# Patient Record
Sex: Female | Born: 1990 | Race: Black or African American | Hispanic: No | Marital: Single | Smoking: Never smoker
Health system: Southern US, Community
[De-identification: ages and names within clinical notes are randomized; demographics above are authoritative.]

## PROBLEM LIST (undated history)

## (undated) DIAGNOSIS — O24419 Gestational diabetes mellitus in pregnancy, unspecified control: Secondary | ICD-10-CM

## (undated) HISTORY — PX: NO PAST SURGERIES: SHX2092

---

## 2013-07-05 ENCOUNTER — Emergency Department (HOSPITAL_COMMUNITY)
Admission: EM | Admit: 2013-07-05 | Discharge: 2013-07-05 | Disposition: A | Discharge status: Home / Self Care | Payer: BC Managed Care – PPO | Attending: Emergency Medicine | Admitting: Emergency Medicine

## 2013-07-05 ENCOUNTER — Encounter (HOSPITAL_COMMUNITY): Payer: Self-pay | Admitting: Emergency Medicine

## 2013-07-05 DIAGNOSIS — R059 Cough, unspecified: Secondary | ICD-10-CM | POA: Insufficient documentation

## 2013-07-05 DIAGNOSIS — R05 Cough: Secondary | ICD-10-CM | POA: Insufficient documentation

## 2013-07-05 DIAGNOSIS — H9209 Otalgia, unspecified ear: Secondary | ICD-10-CM | POA: Insufficient documentation

## 2013-07-05 DIAGNOSIS — R52 Pain, unspecified: Secondary | ICD-10-CM | POA: Insufficient documentation

## 2013-07-05 DIAGNOSIS — R509 Fever, unspecified: Secondary | ICD-10-CM | POA: Insufficient documentation

## 2013-07-05 DIAGNOSIS — J029 Acute pharyngitis, unspecified: Secondary | ICD-10-CM

## 2013-07-05 DIAGNOSIS — Z880 Allergy status to penicillin: Secondary | ICD-10-CM | POA: Insufficient documentation

## 2013-07-05 LAB — RAPID STREP SCREEN (MED CTR MEBANE ONLY): Streptococcus, Group A Screen (Direct): NEGATIVE

## 2013-07-05 MED ORDER — HYDROCODONE-HOMATROPINE 5-1.5 MG/5ML PO SYRP: 5.0000 mL | ORAL_SOLUTION | Freq: Four times a day (QID) | ORAL | Status: DC | PRN

## 2013-07-05 MED ORDER — DEXAMETHASONE SODIUM PHOSPHATE 10 MG/ML IJ SOLN
10.0000 mg | Freq: Once | INTRAMUSCULAR | Status: AC
  Administered 2013-07-05: 10 mg via INTRAMUSCULAR
  Filled 2013-07-05: qty 1

## 2013-07-05 NOTE — ED Notes (Signed)
Pt c/o sore throat x 2 days with body aches

## 2013-07-05 NOTE — ED Notes (Signed)
Harris, PA at bedside for evaluation. 

## 2013-07-05 NOTE — ED Provider Notes (Signed)
CSN: [id number]     Arrival date & time 07/05/13  1229 History  This chart was scribed for non-physician practitioner Arthor Captain, PA-C working with Geoffery Lyons, MD by Joaquin Music, ED Scribe. This patient was seen in room TR05C/TR05C and the patient's care was started at 1:48 PM  Chief Complaint  Patient presents with  . Sore Throat  . Generalized Body Aches   HPI HPI Comments: Latoya Barr is a 22 y.o. female who presents to the Emergency Department complaining of ongoing worsening sore throat and myalgias with associated fever of 101 that began 24 hours ago. Pt states she has pain when swallowing. She states she was having pressure and pain in bilateral ears. She states she was having a cough with some phlegm last night. Pt states she has had a decreased appetite and indicates she has been drinking water. Pt denies having sick contacts. Pt denies rhinorrhea. Pt denies having sick contacts.   Pt states when she was 9-10 y.o. she had a retropharyngeal abscess. She states she was hospitalized. Pt denies having strep throat.  History reviewed. No pertinent past medical history. History reviewed. No pertinent past surgical history. History reviewed. No pertinent family history. History  Substance Use Topics  . Smoking status: Never Smoker   . Smokeless tobacco: Not on file  . Alcohol Use: No   OB History   Grav Para Term Preterm Abortions TAB SAB Ect Mult Living                 Review of Systems  Constitutional: Positive for fever.  HENT: Positive for sore throat. Negative for rhinorrhea.   Musculoskeletal: Positive for myalgias.  All other systems reviewed and are negative.    Allergies  Penicillins  Home Medications  No current outpatient prescriptions on file.  BP 126/76  Pulse 82  Temp(Src) 98.8 F (37.1 C) (Oral)  Resp 16  Ht 5\' 2"  (1.575 m)  Wt 164 lb (74.39 kg)  BMI 29.99 kg/m2  SpO2 98%  Physical Exam  Nursing note and vitals  reviewed. Constitutional: She is oriented to person, place, and time. She appears well-developed and well-nourished. No distress.  HENT:  Head: Normocephalic and atraumatic.  Mild erythema. Uvula edematous and elongated. No exudates on throat. Airway patent. No signs of retropharyngeal  abscess.   Eyes: EOM are normal.  Neck: Neck supple. No tracheal deviation present.  Cardiovascular: Normal rate.   Pulmonary/Chest: Effort normal. No respiratory distress.  Musculoskeletal: Normal range of motion.  Neurological: She is alert and oriented to person, place, and time.  Skin: Skin is warm and dry.  Psychiatric: She has a normal mood and affect. Her behavior is normal.   ED Course  Procedures  DIAGNOSTIC STUDIES: Oxygen Saturation is 98% on RA, normal by my interpretation.    COORDINATION OF CARE: 1:54 PM-Discussed treatment plan which includes administer Decadron while in ED and D/C pt with hycodan. Pt denies Pt agreed to plan.   Labs Review Labs Reviewed  RAPID STREP SCREEN  CULTURE, GROUP A STREP   Imaging Review No results found.  EKG Interpretation   None       MDM   1. Pharyngitis     Pt afebrile without tonsillar exudate, negative strep. Presents with mild cervical lymphadenopathy, & dysphagia; diagnosis of viral pharyngitis. No abx indicated. DC w symptomatic tx for pain  Pt does not appear dehydrated, but did discuss importance of water rehydration. Presentation non concerning for PTA or infxn spread to soft  tissue. No trismus or uvula deviation. Specific return precautions discussed. Pt able to drink water in ED without difficulty with intact air way. Recommended PCP follow up.  I personally performed the services described in this documentation, which was scribed in my presence. The recorded information has been reviewed and is accurate.     Arthor Captain, PA-C 07/05/13 1844

## 2013-07-06 NOTE — ED Provider Notes (Signed)
Medical screening examination/treatment/procedure(s) were performed by non-physician practitioner and as supervising physician I was immediately available for consultation/collaboration.     Mccoy Testa, MD 07/06/13 0725 

## 2013-07-07 LAB — CULTURE, GROUP A STREP

## 2014-07-30 ENCOUNTER — Emergency Department (INDEPENDENT_AMBULATORY_CARE_PROVIDER_SITE_OTHER)
Admission: EM | Admit: 2014-07-30 | Discharge: 2014-07-30 | Disposition: A | Discharge status: Home / Self Care | Payer: BC Managed Care – PPO | Source: Home / Self Care | Attending: Emergency Medicine | Admitting: Emergency Medicine

## 2014-07-30 ENCOUNTER — Encounter (HOSPITAL_COMMUNITY): Payer: Self-pay | Admitting: Emergency Medicine

## 2014-07-30 DIAGNOSIS — G43109 Migraine with aura, not intractable, without status migrainosus: Secondary | ICD-10-CM

## 2014-07-30 LAB — POCT URINALYSIS DIP (DEVICE)
BILIRUBIN URINE: NEGATIVE
Glucose, UA: NEGATIVE mg/dL
Ketones, ur: NEGATIVE mg/dL
Leukocytes, UA: NEGATIVE
NITRITE: NEGATIVE
PH: 6 (ref 5.0–8.0)
PROTEIN: NEGATIVE mg/dL
Specific Gravity, Urine: 1.02 (ref 1.005–1.030)
Urobilinogen, UA: 0.2 mg/dL (ref 0.0–1.0)

## 2014-07-30 LAB — POCT PREGNANCY, URINE: Preg Test, Ur: NEGATIVE

## 2014-07-30 MED ORDER — METOCLOPRAMIDE HCL 5 MG/ML IJ SOLN
10.0000 mg | Freq: Once | INTRAMUSCULAR | Status: AC
  Administered 2014-07-30: 10 mg via INTRAMUSCULAR

## 2014-07-30 MED ORDER — DICLOFENAC SODIUM 75 MG PO TBEC: DELAYED_RELEASE_TABLET | ORAL | Status: DC

## 2014-07-30 MED ORDER — KETOROLAC TROMETHAMINE 60 MG/2ML IM SOLN
60.0000 mg | Freq: Once | INTRAMUSCULAR | Status: AC
  Administered 2014-07-30: 60 mg via INTRAMUSCULAR

## 2014-07-30 MED ORDER — KETOROLAC TROMETHAMINE 60 MG/2ML IM SOLN
INTRAMUSCULAR | Status: AC
  Filled 2014-07-30: qty 2

## 2014-07-30 MED ORDER — METOCLOPRAMIDE HCL 10 MG PO TABS
ORAL_TABLET | ORAL | Status: DC
Start: 2014-07-30 — End: 2015-05-14

## 2014-07-30 MED ORDER — DIPHENHYDRAMINE HCL 50 MG/ML IJ SOLN
INTRAMUSCULAR | Status: AC
  Filled 2014-07-30: qty 1

## 2014-07-30 MED ORDER — METOCLOPRAMIDE HCL 5 MG/ML IJ SOLN
INTRAMUSCULAR | Status: AC
  Filled 2014-07-30: qty 2

## 2014-07-30 MED ORDER — DIPHENHYDRAMINE HCL 50 MG/ML IJ SOLN
25.0000 mg | Freq: Once | INTRAMUSCULAR | Status: AC
  Administered 2014-07-30: 25 mg via INTRAMUSCULAR

## 2014-07-30 NOTE — ED Notes (Signed)
Pt states that she has had headaches and vomiting for a week. Along with being dizzy.

## 2014-07-30 NOTE — Discharge Instructions (Signed)

## 2014-07-30 NOTE — ED Provider Notes (Signed)
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 The headaches are bitemporal and throbbing in nature. Rated 7/10 in intensity. The headaches begin gradually and peak over about 10-15 minutes. The patient has not had any prior history of migraine headaches. She's had a migraine type headache almost every day this past week, and she states these are the worst of her life, but they're not continuous. Sometimes they'll last up to a day at time. They are associated with bright spots in front of her eyes, nausea, vomiting, dizziness, and phonophobia but no photophobia. She denies any other neurological symptoms such as numbness, paresthesias, weakness, difficulty with speech, swallowing, ambulation, coordination, or balance. The patient had a fever to 101 Wednesday which was a week ago. She's not had any fever since then. She denies any stiff neck. She denies any head trauma or use of anticoagulants. The patient is not pregnant right now or in the immediate postpartum time period. She states no one else at her residence is sick with a similar headache. She denies any eye pain or blurry vision. She's had no neck trauma. No jaw claudication. No history of thrombophlebitis or blood clots.  Review of Systems   Other than as noted above, the patient denies any of the following symptoms: Systemic:  No fever, chills, photophobia, or stiff neck. Eye:  No blurred vision, or diplopia. ENT:  No nasal congestion, rhinorrhea, sinus pressure or pain, or sore throat.  No jaw claudication. Neuro:  No paresthesias, loss of consciousness, seizure activity, muscle weakness, trouble with coordination or gait, trouble speaking or swallowing. Psych:  No depression, anxiety or trouble sleeping.  PMFSH   Past medical history, family history, social history, meds, and allergies were reviewed.     Physical Examination    Vital signs:  BP 119/67 mmHg  Pulse 76  Temp(Src) 98 F (36.7 C) (Oral)  Resp 16  SpO2 100%  LMP 07/15/2014 General:  Alert and oriented.  In no distress. Eye:  Lids and conjunctivas normal.  PERRL,  Full EOMs.  Fundi benign with normal discs and vessels. There was no papilledema.  ENT:  No cranial or facial tenderness to palpation.  TMs and canals clear.  Nasal mucosa was normal and uncongested without any drainage. No intra oral lesions, pharynx clear, mucous membranes moist, dentition normal. Neck:  Supple, full ROM, no tenderness to palpation.  No adenopathy or mass. Neuro:  Alert and orented times 3.  Speech was clear, fluent, and appropriate.  Cranial nerves intact. No pronator drift, muscle strength normal. Finger to nose normal.  DTRs were 2+ and symmetrical.Station and gait were normal.  Romberg's sign was normal.  Able to perform tandem gait well. Psych:  Normal affect.   Course in Urgent Care Center   The following medications were given:  Medications  ketorolac (TORADOL) injection 60 mg (60 mg Intramuscular Given 07/30/14 1616)  diphenhydrAMINE (BENADRYL) injection 25 mg (25 mg Intramuscular Given 07/30/14 1617)  metoCLOPramide (REGLAN) injection 10 mg (10 mg Intramuscular Given 07/30/14 1617)   Assessment   The encounter diagnosis was Migraine with aura and without status migrainosus, not intractable.  There is no evidence of subarachnoid hemorrhage, meningitis, encephalitis, subdural hematoma, epidural hematoma, acute glaucoma, carotid dissection, temporal arteritis, cavernous sinus thrombosis, carbon monoxide toxicity, or pseudotumor cerebri.    Plan   1.  Meds:  The following meds  were prescribed:   Discharge Medication List as of 07/30/2014  3:56 PM    START taking these medications   Details  diclofenac (VOLTAREN) 75 MG EC tablet 1 every 12 hours as needed for migraine, Normal    metoCLOPramide (REGLAN) 10 MG tablet 1 every 12 hours  as needed for migraine, Normal        2.  Patient Education/Counseling:  The patient was given appropriate handouts, self care instructions, and instructed in pain control.    3.  Follow up:  The patient was told to follow up here if no better in 2 to 3 days, or sooner if becoming worse in any way, and given some red flag symptoms such as fever, worsening pain, persistent vomiting, or new neurological symptoms which would prompt immediate return.  Follow-up with primary care physician or a headache specialist in the next week to 2.     Reuben Likesavid C Patina Spanier, MD 07/30/14 [phone]

## 2015-01-01 LAB — OB RESULTS CONSOLE HEPATITIS B SURFACE ANTIGEN: HEP B S AG: NEGATIVE

## 2015-01-01 LAB — OB RESULTS CONSOLE RUBELLA ANTIBODY, IGM: RUBELLA: IMMUNE

## 2015-01-01 LAB — OB RESULTS CONSOLE GC/CHLAMYDIA
CHLAMYDIA, DNA PROBE: NEGATIVE
GC PROBE AMP, GENITAL: NEGATIVE

## 2015-01-01 LAB — OB RESULTS CONSOLE RPR: RPR: NONREACTIVE

## 2015-04-26 ENCOUNTER — Encounter (HOSPITAL_COMMUNITY): Payer: Self-pay | Admitting: *Deleted

## 2015-04-26 ENCOUNTER — Inpatient Hospital Stay (HOSPITAL_COMMUNITY)
Admission: AD | Admit: 2015-04-26 | Discharge: 2015-04-26 | Disposition: A | Discharge status: Home / Self Care | Payer: BLUE CROSS/BLUE SHIELD | Source: Ambulatory Visit | Attending: Obstetrics & Gynecology | Admitting: Obstetrics & Gynecology

## 2015-04-26 DIAGNOSIS — O2313 Infections of bladder in pregnancy, third trimester: Secondary | ICD-10-CM | POA: Diagnosis not present

## 2015-04-26 DIAGNOSIS — N3001 Acute cystitis with hematuria: Secondary | ICD-10-CM

## 2015-04-26 DIAGNOSIS — Z880 Allergy status to penicillin: Secondary | ICD-10-CM | POA: Insufficient documentation

## 2015-04-26 DIAGNOSIS — R1030 Lower abdominal pain, unspecified: Secondary | ICD-10-CM | POA: Diagnosis present

## 2015-04-26 DIAGNOSIS — O24419 Gestational diabetes mellitus in pregnancy, unspecified control: Secondary | ICD-10-CM

## 2015-04-26 DIAGNOSIS — Z3A35 35 weeks gestation of pregnancy: Secondary | ICD-10-CM | POA: Insufficient documentation

## 2015-04-26 DIAGNOSIS — O26893 Other specified pregnancy related conditions, third trimester: Secondary | ICD-10-CM | POA: Diagnosis not present

## 2015-04-26 HISTORY — DX: Gestational diabetes mellitus in pregnancy, unspecified control: O24.419

## 2015-04-26 LAB — OB RESULTS CONSOLE GC/CHLAMYDIA: GC PROBE AMP, GENITAL: NEGATIVE

## 2015-04-26 LAB — COMPREHENSIVE METABOLIC PANEL
ALBUMIN: 2.8 g/dL — AB (ref 3.5–5.0)
ALT: 25 U/L (ref 14–54)
AST: 31 U/L (ref 15–41)
Alkaline Phosphatase: 112 U/L (ref 38–126)
Anion gap: 12 (ref 5–15)
BUN: 7 mg/dL (ref 6–20)
CO2: 25 mmol/L (ref 22–32)
Calcium: 9 mg/dL (ref 8.9–10.3)
Chloride: 99 mmol/L — ABNORMAL LOW (ref 101–111)
Creatinine, Ser: 0.59 mg/dL (ref 0.44–1.00)
GFR calc Af Amer: >60 mL/min (ref >60)
GFR calc non Af Amer: >60 mL/min (ref >60)
GLUCOSE: 102 mg/dL — AB (ref 65–99)
POTASSIUM: 3.6 mmol/L (ref 3.5–5.1)
SODIUM: 136 mmol/L (ref 135–145)
Total Bilirubin: 0.4 mg/dL (ref 0.3–1.2)
Total Protein: 6.6 g/dL (ref 6.5–8.1)

## 2015-04-26 LAB — CBC
HCT: 34.8 % — ABNORMAL LOW (ref 36.0–46.0)
Hemoglobin: 11.5 g/dL — ABNORMAL LOW (ref 12.0–15.0)
MCH: 28.3 pg (ref 26.0–34.0)
MCHC: 33 g/dL (ref 30.0–36.0)
MCV: 85.7 fL (ref 78.0–100.0)
Platelets: 311 10*3/uL (ref 150–400)
RBC: 4.06 MIL/uL (ref 3.87–5.11)
RDW: 15.4 % (ref 11.5–15.5)
WBC: 7.7 10*3/uL (ref 4.0–10.5)

## 2015-04-26 LAB — OB RESULTS CONSOLE GBS: GBS: POSITIVE

## 2015-04-26 LAB — URINALYSIS, ROUTINE W REFLEX MICROSCOPIC
Bilirubin Urine: NEGATIVE
GLUCOSE, UA: NEGATIVE mg/dL
Ketones, ur: 15 mg/dL — AB
Nitrite: NEGATIVE
PROTEIN: 100 mg/dL — AB
Specific Gravity, Urine: 1.02 (ref 1.005–1.030)
UROBILINOGEN UA: 0.2 mg/dL (ref 0.0–1.0)
pH: 7 (ref 5.0–8.0)

## 2015-04-26 LAB — WET PREP, GENITAL
CLUE CELLS WET PREP: NONE SEEN
Trich, Wet Prep: NONE SEEN
WBC, Wet Prep HPF POC: NONE SEEN
Yeast Wet Prep HPF POC: NONE SEEN

## 2015-04-26 LAB — URINE MICROSCOPIC-ADD ON

## 2015-04-26 MED ORDER — CEPHALEXIN 500 MG PO CAPS: 500.0000 mg | ORAL_CAPSULE | Freq: Four times a day (QID) | ORAL | Status: DC

## 2015-04-26 NOTE — MAU Provider Note (Signed)
History     CSN: [id number] Arrival date and time: 04/26/15 4098 First Provider Initiated Contact with Patient 04/26/15 [phone]      Chief Complaint  Patient presents with  . Abdominal Pain   HPI Patient is 24 y.o. G1P0 [redacted]w[redacted]d here with complaints of lower abdominal pain since 9/1, Pain is worse in the suprapubic area,  worsening this AM, reports as constant. Worse with side lying, walking, going up stairs. Denies alleviating. Has not tried tylenol. Denies trying heating pad. Warm bath did not help.  Denies dysuria, polyuria, denies flank pain.   Rates the pain: 10/10 (here worse pain previous is braxton hicks, dislocated knee)  +FM, denies LOF, VB, contractions +clear/mushy, looks like gel vaginal discharge.  [facility] in [place], [place] Reports some elevated pressures in early pregnancy BS < 100 in AM, after eating < 120  OB History    Gravida Para Term Preterm AB TAB SAB Ectopic Multiple Living   1               Past Medical History  Diagnosis Date  . Gestational diabetes     on medication    Past Surgical History  Procedure Laterality Date  . No past surgeries      No family history on file.  Social History  Substance Use Topics  . Smoking status: Never Smoker   . Smokeless tobacco: None  . Alcohol Use: No    Allergies:  Allergies  Allergen Reactions  . Penicillins Other (See Comments)  . Amoxicillin     Prescriptions prior to admission  Medication Sig Dispense Refill Last Dose  . diclofenac (VOLTAREN) 75 MG EC tablet 1 every 12 hours as needed for migraine 30 tablet 2   . HYDROcodone-homatropine (HYCODAN) 5-1.5 MG/5ML syrup Take 5 mLs by mouth every 6 (six) hours as needed for cough. 30 mL 0   . metoCLOPramide (REGLAN) 10 MG tablet 1 every 12 hours as needed for migraine 30 tablet 2     Review of Systems  Constitutional: Negative for fever and chills.  Eyes: Negative for blurred vision and double vision.  Respiratory: Negative for cough  (used to get mirgaines. had 1 HA when she choked on her spit) and shortness of breath.   Cardiovascular: Negative for chest pain and orthopnea.  Gastrointestinal: Positive for vomiting (happened once last night) and abdominal pain (midepigastric). Negative for nausea, constipation and blood in stool.  Genitourinary: Negative for dysuria, frequency and flank pain.  Musculoskeletal: Negative for myalgias.  Skin: Negative for rash.  Neurological: Positive for headaches. Negative for dizziness, tingling and weakness.  Endo/Heme/Allergies: Does not bruise/bleed easily.  Psychiatric/Behavioral: Negative for depression and suicidal ideas. The patient is not nervous/anxious.    Physical Exam   Blood pressure 120/72, pulse 107, temperature 97.6 F (36.4 C), temperature source Oral, resp. rate 20, height  (1.6 m), weight 215 lb (97.523 kg), last menstrual period 07/15/2014.  Physical Exam  Constitutional: She is oriented to person, place, and time. She appears well-developed and well-nourished. No distress.  Pregnant female  HENT:  Head: Normocephalic and atraumatic.  Eyes: Conjunctivae are normal. No scleral icterus.  Neck: Normal range of motion. Neck supple.  Cardiovascular: Normal rate and intact distal pulses.   Respiratory: Effort normal. She exhibits no tenderness.  GI: Soft. There is tenderness (TTP in the suprapubic region. Mild TTP epigastric but none over RUQ). There is no rebound and no guarding.  Gravid. No CVA tenderness.   Genitourinary: Vagina  normal.  Musculoskeletal: Normal range of motion. She exhibits no edema.  Neurological: She is alert and oriented to person, place, and time. No cranial nerve deficit. Coordination normal.  Skin: Skin is warm and dry. No rash noted.  Psychiatric: She has a normal mood and affect.    MAU Course  Procedures  MDM UA- + hematuria, +LE CMP CBC Type&Screen Wet prep- negative for BV/yeast/trich GC/CT collected  NST  145/mod/+accels, no decels Toco: occasional ctx, not felt by patient  Assessment and Plan  Latoya Barr is a 24 y.o. G1P0 at [redacted]w[redacted]d presenting with abdominal pain, suprapubic pain with UA suggestive UTI.   #Lower Abdominal pain: ddx includes preterm labor, urinary tract infection, pelvic infection. Unlikely PTL given this is not an intermittent pain and toco that is reassuring, most likely cystitis given location of pain and hematuria- not concerned for pyelo give lack of systemic sx like fever and CVA tenderness.  Wet prep negative thus unlikely BV, yeast, trich. Could be GC/CT but these cx will be not be back for 48 hr - UA suggestive of UTI, follow up urine culture - Will treat Keflex, discussed with patient her PCN allergy and chance of cross reaction of 10% and that should she develop hives she should stop immediately and come to MAU. She voiced understanding - preterm labor precautions  #FWB: Cat I strip, reactive.   #Pregnancy care: patient previous seen at another facility but "might deliver here" - request records from outside facility - reviewed pregnancy hx with patient - obtained limited prenatal labs   Federico Flake 04/26/2015, 8:51 AM

## 2015-04-26 NOTE — MAU Note (Addendum)
Pt C/O lower abd pain since 0400, denies bleeding or LOF.  Denies dysuria.  Gets PNC in [place].

## 2015-04-26 NOTE — Discharge Instructions (Signed)

## 2015-04-27 LAB — HIV ANTIBODY (ROUTINE TESTING W REFLEX): HIV Screen 4th Generation wRfx: NONREACTIVE

## 2015-04-27 LAB — RPR: RPR Ser Ql: NONREACTIVE

## 2015-04-28 LAB — CULTURE, OB URINE: Special Requests: NORMAL

## 2015-04-30 LAB — GC/CHLAMYDIA PROBE AMP (~~LOC~~) NOT AT ARMC
Chlamydia: NEGATIVE
Neisseria Gonorrhea: NEGATIVE

## 2015-05-09 ENCOUNTER — Inpatient Hospital Stay (HOSPITAL_COMMUNITY)
Admission: AD | Admit: 2015-05-09 | Discharge: 2015-05-09 | Disposition: A | Discharge status: Home / Self Care | Payer: BLUE CROSS/BLUE SHIELD | Source: Ambulatory Visit | Attending: Family Medicine | Admitting: Family Medicine

## 2015-05-09 ENCOUNTER — Encounter (HOSPITAL_COMMUNITY): Payer: Self-pay | Admitting: *Deleted

## 2015-05-09 DIAGNOSIS — O24419 Gestational diabetes mellitus in pregnancy, unspecified control: Secondary | ICD-10-CM

## 2015-05-09 DIAGNOSIS — O368130 Decreased fetal movements, third trimester, not applicable or unspecified: Secondary | ICD-10-CM | POA: Diagnosis present

## 2015-05-09 DIAGNOSIS — Z3A37 37 weeks gestation of pregnancy: Secondary | ICD-10-CM | POA: Diagnosis not present

## 2015-05-09 NOTE — Discharge Instructions (Signed)
Braxton Hicks Contractions °Contractions of the uterus can occur throughout pregnancy. Contractions are not always a sign that you are in labor.  °WHAT ARE BRAXTON HICKS CONTRACTIONS?  °Contractions that occur before labor are called Braxton Hicks contractions, or false labor. Toward the end of pregnancy (32-34 weeks), these contractions can develop more often and may become more forceful. This is not true labor because these contractions do not result in opening (dilatation) and thinning of the cervix. They are sometimes difficult to tell apart from true labor because these contractions can be forceful and people have different pain tolerances. You should not feel embarrassed if you go to the hospital with false labor. Sometimes, the only way to tell if you are in true labor is for your health care provider to look for changes in the cervix. °If there are no prenatal problems or other health problems associated with the pregnancy, it is completely safe to be sent home with false labor and await the onset of true labor. °HOW CAN YOU TELL THE DIFFERENCE BETWEEN TRUE AND FALSE LABOR? °False Labor °· The contractions of false labor are usually shorter and not as hard as those of true labor.   °· The contractions are usually irregular.   °· The contractions are often felt in the front of the lower abdomen and in the groin.   °· The contractions may go away when you walk around or change positions while lying down.   °· The contractions get weaker and are shorter lasting as time goes on.   °· The contractions do not usually become progressively stronger, regular, and closer together as with true labor.   °True Labor °1. Contractions in true labor last 30-70 seconds, become very regular, usually become more intense, and increase in frequency.   °2. The contractions do not go away with walking.   °3. The discomfort is usually felt in the top of the uterus and spreads to the lower abdomen and low back.   °4. True labor can  be determined by your health care provider with an exam. This will show that the cervix is dilating and getting thinner.   °WHAT TO REMEMBER °· Keep up with your usual exercises and follow other instructions given by your health care provider.   °· Take medicines as directed by your health care provider.   °· Keep your regular prenatal appointments.   °· Eat and drink lightly if you think you are going into labor.   °· If Braxton Hicks contractions are making you uncomfortable:   °· Change your position from lying down or resting to walking, or from walking to resting.   °· Sit and rest in a tub of warm water.   °· Drink 2-3 glasses of water. Dehydration may cause these contractions.   °· Do slow and deep breathing several times an hour.   °WHEN SHOULD I SEEK IMMEDIATE MEDICAL CARE? °Seek immediate medical care if: °· Your contractions become stronger, more regular, and closer together.   °· You have fluid leaking or gushing from your vagina.   °· You have a fever.   °· You pass blood-tinged mucus.   °· You have vaginal bleeding.   °· You have continuous abdominal pain.   °· You have low back pain that you never had before.   °· You feel your baby's head pushing down and causing pelvic pressure.   °· Your baby is not moving as much as it used to.   °Document Released: 08/10/2005 Document Revised: 08/15/2013 Document Reviewed: 05/22/2013 °[employer]® Patient Information ©2015 [employer]. This information is not intended to replace advice given to you by your health care   provider. Make sure you discuss any questions you have with your health care provider. ° °Fetal Movement Counts °Patient Name: __________________________________________________ Patient Due Date: ____________________ °Performing a fetal movement count is highly recommended in high-risk pregnancies, but it is good for every pregnant woman to do. Your health care provider may ask you to start counting fetal movements at 28 weeks of the pregnancy. Fetal  movements often increase: °· After eating a full meal. °· After physical activity. °· After eating or drinking something sweet or cold. °· At rest. °Pay attention to when you feel the baby is most active. This will help you notice a pattern of your baby's sleep and wake cycles and what factors contribute to an increase in fetal movement. It is important to perform a fetal movement count at the same time each day when your baby is normally most active.  °HOW TO COUNT FETAL MOVEMENTS °5. Find a quiet and comfortable area to sit or lie down on your left side. Lying on your left side provides the best blood and oxygen circulation to your baby. °6. Write down the day and time on a sheet of paper or in a journal. °7. Start counting kicks, flutters, swishes, rolls, or jabs in a 2-hour period. You should feel at least 10 movements within 2 hours. °8. If you do not feel 10 movements in 2 hours, wait 2-3 hours and count again. Look for a change in the pattern or not enough counts in 2 hours. °SEEK MEDICAL CARE IF: °· You feel less than 10 counts in 2 hours, tried twice. °· There is no movement in over an hour. °· The pattern is changing or taking longer each day to reach 10 counts in 2 hours. °· You feel the baby is not moving as he or she usually does. °Date: ____________ Movements: ____________ Start time: ____________ Finish time: ____________  °Date: ____________ Movements: ____________ Start time: ____________ Finish time: ____________ °Date: ____________ Movements: ____________ Start time: ____________ Finish time: ____________ °Date: ____________ Movements: ____________ Start time: ____________ Finish time: ____________ °Date: ____________ Movements: ____________ Start time: ____________ Finish time: ____________ °Date: ____________ Movements: ____________ Start time: ____________ Finish time: ____________ °Date: ____________ Movements: ____________ Start time: ____________ Finish time: ____________ °Date: ____________  Movements: ____________ Start time: ____________ Finish time: ____________  °Date: ____________ Movements: ____________ Start time: ____________ Finish time: ____________ °Date: ____________ Movements: ____________ Start time: ____________ Finish time: ____________ °Date: ____________ Movements: ____________ Start time: ____________ Finish time: ____________ °Date: ____________ Movements: ____________ Start time: ____________ Finish time: ____________ °Date: ____________ Movements: ____________ Start time: ____________ Finish time: ____________ °Date: ____________ Movements: ____________ Start time: ____________ Finish time: ____________ °Date: ____________ Movements: ____________ Start time: ____________ Finish time: ____________  °Date: ____________ Movements: ____________ Start time: ____________ Finish time: ____________ °Date: ____________ Movements: ____________ Start time: ____________ Finish time: ____________ °Date: ____________ Movements: ____________ Start time: ____________ Finish time: ____________ °Date: ____________ Movements: ____________ Start time: ____________ Finish time: ____________ °Date: ____________ Movements: ____________ Start time: ____________ Finish time: ____________ °Date: ____________ Movements: ____________ Start time: ____________ Finish time: ____________ °Date: ____________ Movements: ____________ Start time: ____________ Finish time: ____________  °Date: ____________ Movements: ____________ Start time: ____________ Finish time: ____________ °Date: ____________ Movements: ____________ Start time: ____________ Finish time: ____________ °Date: ____________ Movements: ____________ Start time: ____________ Finish time: ____________ °Date: ____________ Movements: ____________ Start time: ____________ Finish time: ____________ °Date: ____________ Movements: ____________ Start time: ____________ Finish time: ____________ °Date: ____________ Movements: ____________ Start time:  ____________ Finish time: ____________ °Date: ____________ Movements:   ____________ Start time: ____________ Finish time: ____________  °Date: ____________ Movements: ____________ Start time: ____________ Finish time: ____________ °Date: ____________ Movements: ____________ Start time: ____________ Finish time: ____________ °Date: ____________ Movements: ____________ Start time: ____________ Finish time: ____________ °Date: ____________ Movements: ____________ Start time: ____________ Finish time: ____________ °Date: ____________ Movements: ____________ Start time: ____________ Finish time: ____________ °Date: ____________ Movements: ____________ Start time: ____________ Finish time: ____________ °Date: ____________ Movements: ____________ Start time: ____________ Finish time: ____________  °Date: ____________ Movements: ____________ Start time: ____________ Finish time: ____________ °Date: ____________ Movements: ____________ Start time: ____________ Finish time: ____________ °Date: ____________ Movements: ____________ Start time: ____________ Finish time: ____________ °Date: ____________ Movements: ____________ Start time: ____________ Finish time: ____________ °Date: ____________ Movements: ____________ Start time: ____________ Finish time: ____________ °Date: ____________ Movements: ____________ Start time: ____________ Finish time: ____________ °Date: ____________ Movements: ____________ Start time: ____________ Finish time: ____________  °Date: ____________ Movements: ____________ Start time: ____________ Finish time: ____________ °Date: ____________ Movements: ____________ Start time: ____________ Finish time: ____________ °Date: ____________ Movements: ____________ Start time: ____________ Finish time: ____________ °Date: ____________ Movements: ____________ Start time: ____________ Finish time: ____________ °Date: ____________ Movements: ____________ Start time: ____________ Finish time: ____________ °Date:  ____________ Movements: ____________ Start time: ____________ Finish time: ____________ °Date: ____________ Movements: ____________ Start time: ____________ Finish time: ____________  °Date: ____________ Movements: ____________ Start time: ____________ Finish time: ____________ °Date: ____________ Movements: ____________ Start time: ____________ Finish time: ____________ °Date: ____________ Movements: ____________ Start time: ____________ Finish time: ____________ °Date: ____________ Movements: ____________ Start time: ____________ Finish time: ____________ °Date: ____________ Movements: ____________ Start time: ____________ Finish time: ____________ °Date: ____________ Movements: ____________ Start time: ____________ Finish time: ____________ °Document Released: 09/09/2006 Document Revised: 12/25/2013 Document Reviewed: 06/06/2012 °[employer]® Patient Information ©2015 [employer]. This information is not intended to replace advice given to you by your health care provider. Make sure you discuss any questions you have with your health care provider. ° °

## 2015-05-09 NOTE — MAU Note (Signed)
Per Dr Karilyn Cota pt at 1815.

## 2015-05-09 NOTE — MAU Note (Signed)
Pt reports she has had decreased fetal movement today. C/o back pain and cramping on and off and having some vomiting.

## 2015-05-14 ENCOUNTER — Encounter (HOSPITAL_COMMUNITY): Payer: Self-pay | Admitting: *Deleted

## 2015-05-14 ENCOUNTER — Inpatient Hospital Stay (HOSPITAL_COMMUNITY)
Admission: AD | Admit: 2015-05-14 | Discharge: 2015-05-14 | Disposition: A | Discharge status: Home / Self Care | Payer: BLUE CROSS/BLUE SHIELD | Source: Ambulatory Visit | Attending: Obstetrics & Gynecology | Admitting: Obstetrics & Gynecology

## 2015-05-14 DIAGNOSIS — O24419 Gestational diabetes mellitus in pregnancy, unspecified control: Secondary | ICD-10-CM | POA: Diagnosis not present

## 2015-05-14 DIAGNOSIS — O26899 Other specified pregnancy related conditions, unspecified trimester: Secondary | ICD-10-CM

## 2015-05-14 DIAGNOSIS — Z79899 Other long term (current) drug therapy: Secondary | ICD-10-CM | POA: Insufficient documentation

## 2015-05-14 DIAGNOSIS — Z3A38 38 weeks gestation of pregnancy: Secondary | ICD-10-CM | POA: Insufficient documentation

## 2015-05-14 DIAGNOSIS — R109 Unspecified abdominal pain: Secondary | ICD-10-CM

## 2015-05-14 DIAGNOSIS — Z880 Allergy status to penicillin: Secondary | ICD-10-CM | POA: Diagnosis not present

## 2015-05-14 LAB — URINALYSIS, ROUTINE W REFLEX MICROSCOPIC
Bilirubin Urine: NEGATIVE
GLUCOSE, UA: NEGATIVE mg/dL
Hgb urine dipstick: NEGATIVE
Ketones, ur: NEGATIVE mg/dL
LEUKOCYTES UA: NEGATIVE
Nitrite: NEGATIVE
PH: 7 (ref 5.0–8.0)
PROTEIN: NEGATIVE mg/dL
Specific Gravity, Urine: <1.005 — ABNORMAL LOW (ref 1.005–1.030)
Urobilinogen, UA: 0.2 mg/dL (ref 0.0–1.0)

## 2015-05-14 LAB — POCT FERN TEST: POCT Fern Test: NEGATIVE

## 2015-05-14 NOTE — MAU Provider Note (Signed)
History     CSN: [id number]  Arrival date and time: 05/14/15 2144   First Provider Initiated Contact with Patient 05/14/15 2234      Chief Complaint  Patient presents with  . Contractions   HPI  Patient here for labor eval. Has been feeling contractions on and off all day. States they started this morning and has been feeling back pain constantly and abdominal tighting at least once an hour. She thinks she may have had a small loss of fluid earlier that was clear but none since. Endorses good fetal movement. No vaginal bleeding. No other vaginal discharge. Is taking glyburide 2.5mg  PO qhs for gestational diabetes and endorses good control at home. Does have limited prenatal care as she is from [place] and is going to school here in [place]. Patient thinks she will probably deliver baby here.   OB History    Gravida Para Term Preterm AB TAB SAB Ectopic Multiple Living   1               Past Medical History  Diagnosis Date  . Gestational diabetes     on medication    Past Surgical History  Procedure Laterality Date  . No past surgeries      Family History  Problem Relation Age of Onset  . Diabetes Neg Hx   . Heart attack Neg Hx     Social History  Substance Use Topics  . Smoking status: Never Smoker   . Smokeless tobacco: None  . Alcohol Use: No    Allergies:  Allergies  Allergen Reactions  . Penicillins Hives    Blood pressure drops Has patient had a PCN reaction causing immediate rash, facial/tongue/throat swelling, SOB or lightheadedness with hypotension: No Has patient had a PCN reaction causing severe rash involving mucus membranes or skin necrosis: Yes Has patient had a PCN reaction that required hospitalization Yes Has patient had a PCN reaction occurring within the last 10 years: No If all of the above answers are "NO", then may proceed with Cephalosporin use.   Marland Kitchen Amoxicillin Hives    Blood pressure drops  . Ampicillin Hives    Blood  pressure drops    Prescriptions prior to admission  Medication Sig Dispense Refill Last Dose  . glyBURIDE (DIABETA) 2.5 MG tablet Take 2.5 mg by mouth at bedtime.   05/13/2015 at Unknown time  . Prenatal Vit-Fe Fumarate-FA (PRENATAL MULTIVITAMIN) TABS tablet Take 1 tablet by mouth daily at 12 noon.   05/14/2015 at Unknown time  . cephALEXin (KEFLEX) 500 MG capsule Take 1 capsule (500 mg total) by mouth 4 (four) times daily. (Patient taking differently: Take 500 mg by mouth 4 (four) times daily. Started 9/2) 28 capsule 2 05/08/2015 at Unknown time  . metoCLOPramide (REGLAN) 10 MG tablet 1 every 12 hours as needed for migraine (Patient not taking: Reported on 04/26/2015) 30 tablet 2 Not Taking at Unknown time    ROSNegative except as listed in HPI Physical Exam   Blood pressure 138/59, pulse 105, temperature 98.2 F (36.8 C), temperature source Oral, resp. rate 16, height  (1.575 m), weight 219 lb (99.338 kg), last menstrual period 07/15/2014, SpO2 99 %.  Physical Exam  Constitutional: She is oriented to person, place, and time. She appears well-developed and well-nourished.  HENT:  Head: Normocephalic and atraumatic.  Eyes: Conjunctivae are normal. Pupils are equal, round, and reactive to light.  Cardiovascular: Intact distal pulses.   Respiratory: Effort normal. No respiratory distress.  GI: Soft. Bowel sounds are normal. She exhibits no distension. There is no tenderness.  Genitourinary: Vagina normal.  Cervical exam: fingertip/thick/ballotable/posterior/medium  Neurological: She is alert and oriented to person, place, and time.  Skin: Skin is warm and dry.  Psychiatric: She has a normal mood and affect. Her behavior is normal.    MAU Course  Procedures  MDM EFM: 140/min to mod variability/+accels, no decels, toco: one contraction seen on monitor No ferning, no pooling Closed/thick/high/posterior  Assessment and Plan  G1P0 at 38+1 here for labor eval -Not in labor -Gave  labor precautions -OB in [place], likely will deliver here, limited care, gDM on glyburide -F/u tomorrow here at 1pm for prenatal care as likely will deliver here -GBS obtained, needs sensitivities as has PCN allergy (hives and drop in BP per pt)  Durenda Hurt 05/14/2015, 11:34 PM

## 2015-05-14 NOTE — MAU Note (Signed)
Pt states she is having lower back pain, lower abd pain and a headache. Symptoms present all day.

## 2015-05-14 NOTE — MAU Note (Signed)
Pt also reports ? Leaking fluid at 6pm

## 2015-05-15 ENCOUNTER — Encounter: Payer: BLUE CROSS/BLUE SHIELD | Admitting: Obstetrics and Gynecology

## 2015-05-21 ENCOUNTER — Inpatient Hospital Stay (HOSPITAL_COMMUNITY)
Admission: AD | Admit: 2015-05-21 | Discharge: 2015-05-25 | DRG: 766 | Disposition: A | Discharge status: Home / Self Care | Payer: BLUE CROSS/BLUE SHIELD | Source: Ambulatory Visit | Attending: Family Medicine | Admitting: Family Medicine

## 2015-05-21 ENCOUNTER — Inpatient Hospital Stay (HOSPITAL_COMMUNITY): Payer: BLUE CROSS/BLUE SHIELD

## 2015-05-21 ENCOUNTER — Inpatient Hospital Stay (HOSPITAL_COMMUNITY): Payer: BLUE CROSS/BLUE SHIELD | Admitting: Anesthesiology

## 2015-05-21 ENCOUNTER — Encounter (HOSPITAL_COMMUNITY): Payer: Self-pay | Admitting: *Deleted

## 2015-05-21 DIAGNOSIS — O3663X1 Maternal care for excessive fetal growth, third trimester, fetus 1: Secondary | ICD-10-CM

## 2015-05-21 DIAGNOSIS — O24429 Gestational diabetes mellitus in childbirth, unspecified control: Secondary | ICD-10-CM | POA: Diagnosis present

## 2015-05-21 DIAGNOSIS — O368390 Maternal care for abnormalities of the fetal heart rate or rhythm, unspecified trimester, not applicable or unspecified: Secondary | ICD-10-CM

## 2015-05-21 DIAGNOSIS — Z6838 Body mass index (BMI) 38.0-38.9, adult: Secondary | ICD-10-CM

## 2015-05-21 DIAGNOSIS — Z3A39 39 weeks gestation of pregnancy: Secondary | ICD-10-CM | POA: Diagnosis not present

## 2015-05-21 DIAGNOSIS — O99824 Streptococcus B carrier state complicating childbirth: Secondary | ICD-10-CM | POA: Diagnosis present

## 2015-05-21 DIAGNOSIS — O24425 Gestational diabetes mellitus in childbirth, controlled by oral hypoglycemic drugs: Secondary | ICD-10-CM | POA: Diagnosis present

## 2015-05-21 DIAGNOSIS — O24419 Gestational diabetes mellitus in pregnancy, unspecified control: Secondary | ICD-10-CM | POA: Diagnosis present

## 2015-05-21 DIAGNOSIS — O0933 Supervision of pregnancy with insufficient antenatal care, third trimester: Secondary | ICD-10-CM | POA: Diagnosis not present

## 2015-05-21 LAB — TYPE AND SCREEN
ABO/RH(D): O POS
ANTIBODY SCREEN: NEGATIVE

## 2015-05-21 LAB — RAPID URINE DRUG SCREEN, HOSP PERFORMED
Amphetamines: NOT DETECTED
BARBITURATES: NOT DETECTED
Benzodiazepines: NOT DETECTED
Cocaine: NOT DETECTED
Opiates: NOT DETECTED
TETRAHYDROCANNABINOL: NOT DETECTED

## 2015-05-21 LAB — CBC
HCT: 32.8 % — ABNORMAL LOW (ref 36.0–46.0)
HEMOGLOBIN: 11 g/dL — AB (ref 12.0–15.0)
MCH: 28.5 pg (ref 26.0–34.0)
MCHC: 33.5 g/dL (ref 30.0–36.0)
MCV: 85 fL (ref 78.0–100.0)
Platelets: 296 10*3/uL (ref 150–400)
RBC: 3.86 MIL/uL — ABNORMAL LOW (ref 3.87–5.11)
RDW: 16 % — ABNORMAL HIGH (ref 11.5–15.5)
WBC: 8.8 10*3/uL (ref 4.0–10.5)

## 2015-05-21 LAB — URINE MICROSCOPIC-ADD ON

## 2015-05-21 LAB — URINALYSIS, ROUTINE W REFLEX MICROSCOPIC
Bilirubin Urine: NEGATIVE
Glucose, UA: NEGATIVE mg/dL
KETONES UR: 15 mg/dL — AB
NITRITE: NEGATIVE
PROTEIN: NEGATIVE mg/dL
Specific Gravity, Urine: 1.01 (ref 1.005–1.030)
UROBILINOGEN UA: 0.2 mg/dL (ref 0.0–1.0)
pH: 7 (ref 5.0–8.0)

## 2015-05-21 LAB — GLUCOSE, CAPILLARY
GLUCOSE-CAPILLARY: 97 mg/dL (ref 65–99)
Glucose-Capillary: 106 mg/dL — ABNORMAL HIGH (ref 65–99)
Glucose-Capillary: 91 mg/dL (ref 65–99)
Glucose-Capillary: 82 mg/dL (ref 65–99)

## 2015-05-21 LAB — DIFFERENTIAL
Basophils Absolute: 0 10*3/uL (ref 0.0–0.1)
Basophils Relative: 0 %
EOS ABS: 0 10*3/uL (ref 0.0–0.7)
EOS PCT: 0 %
LYMPHS ABS: 1.8 10*3/uL (ref 0.7–4.0)
Lymphocytes Relative: 20 %
Monocytes Absolute: 1 10*3/uL (ref 0.1–1.0)
Monocytes Relative: 11 %
NEUTROS PCT: 69 %
Neutro Abs: 6 10*3/uL (ref 1.7–7.7)

## 2015-05-21 LAB — ABO/RH: ABO/RH(D): O POS

## 2015-05-21 IMAGING — US US MFM OB COMP +14 WKS
1 series · 14 of 28 positions shown · non-contrast
Comparison: none

[Series 1: us mfm ob comp +14 wks · 14 of 31 slices shown]
[im 2/31]
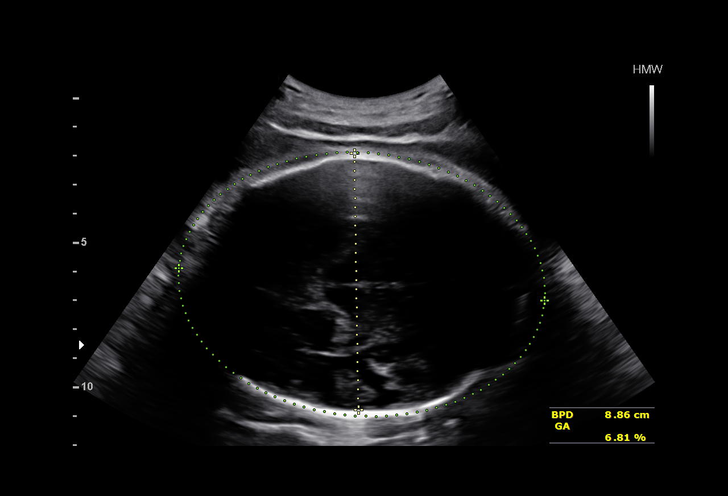
[im 4/31]
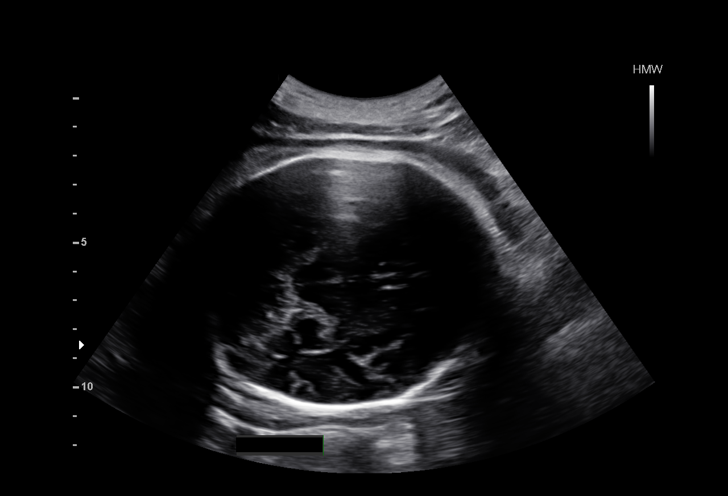
[im 6/31]
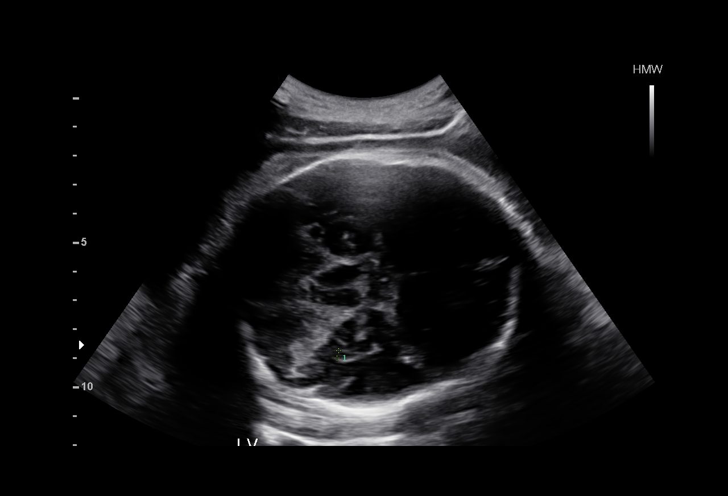
[im 8/31]
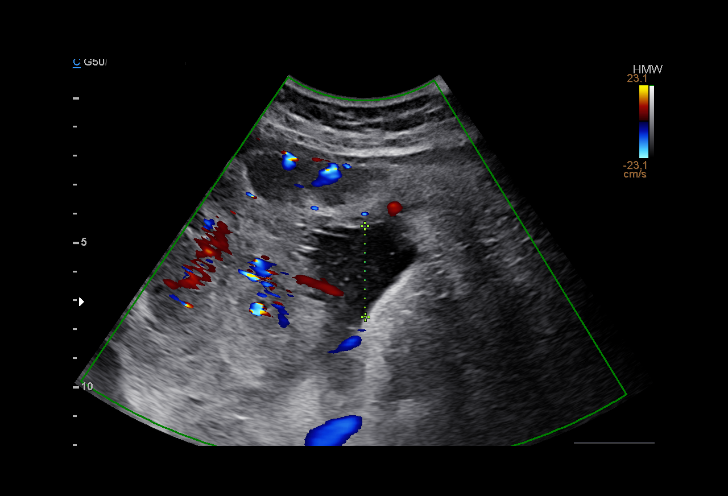
[im 11/31]
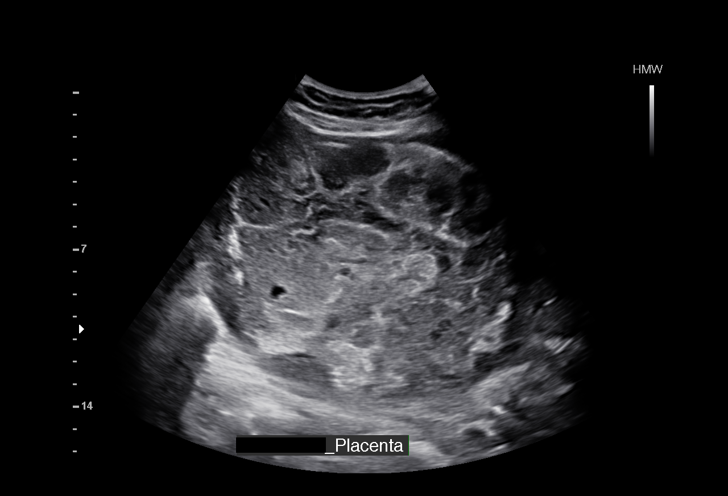
[im 13/31]
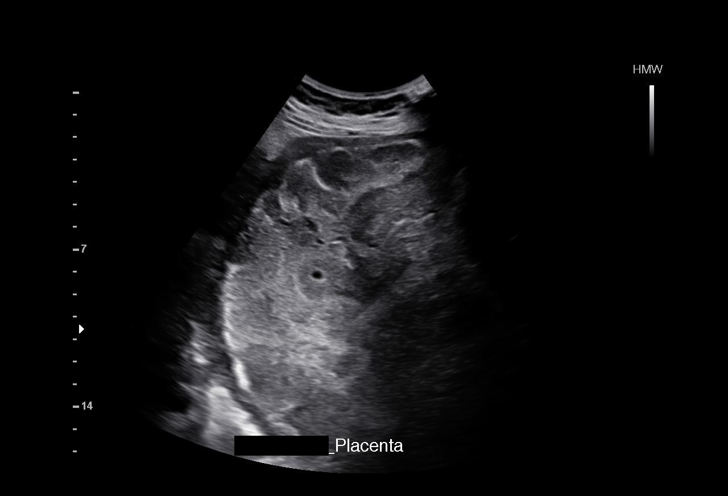
[im 15/31]
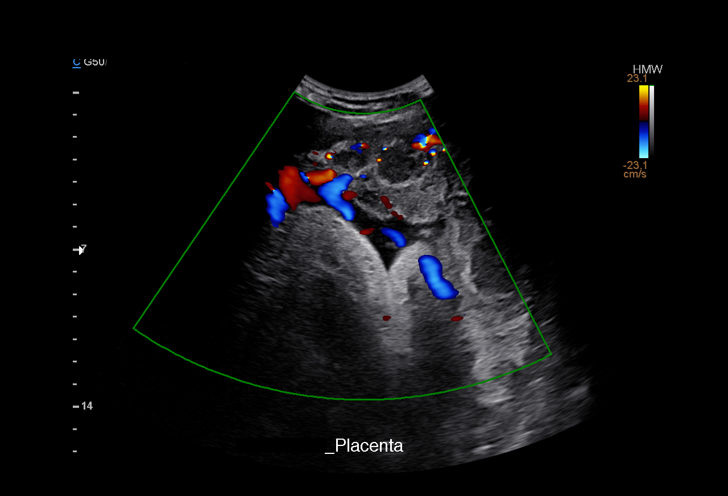
[im 17/31]
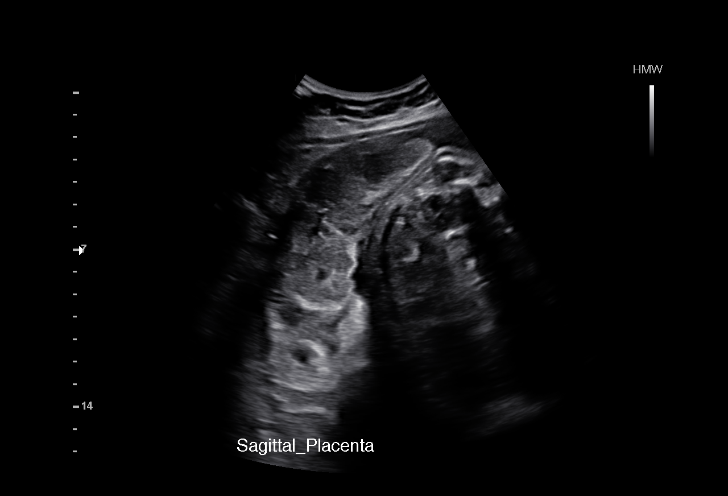
[im 19/31]
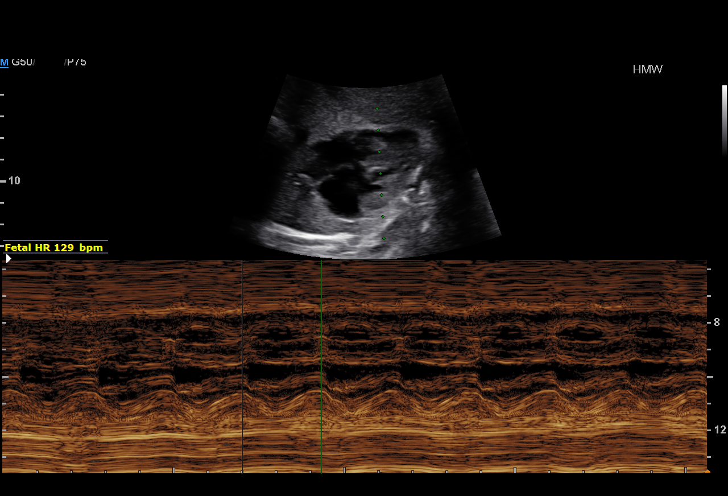
[im 22/31]
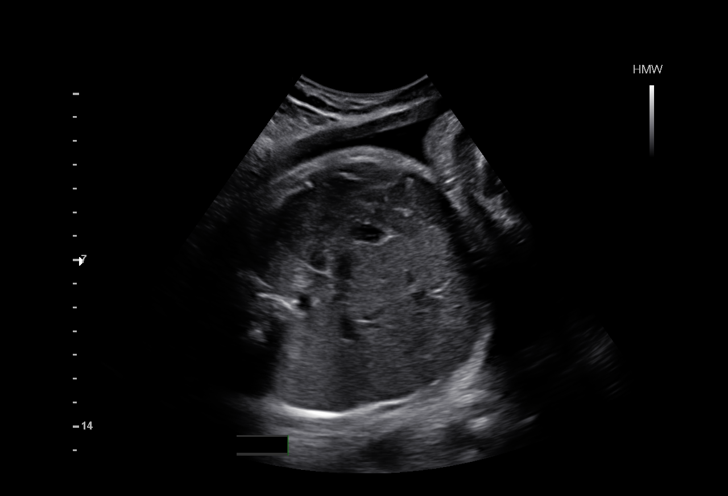
[im 24/31]
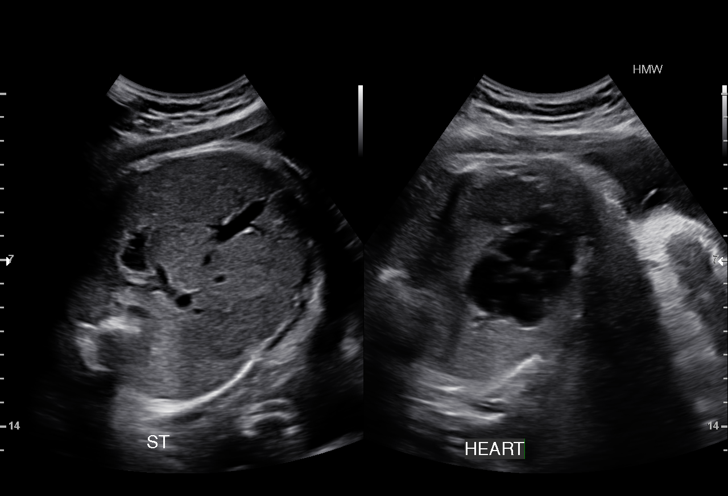
[im 26/31]
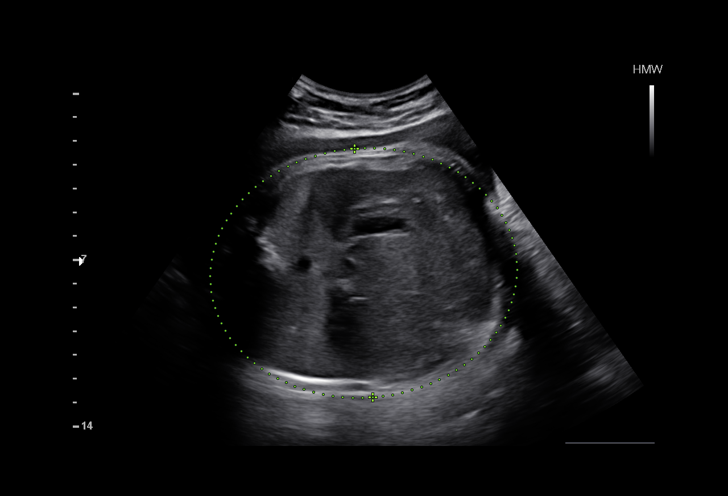
[im 28/31]
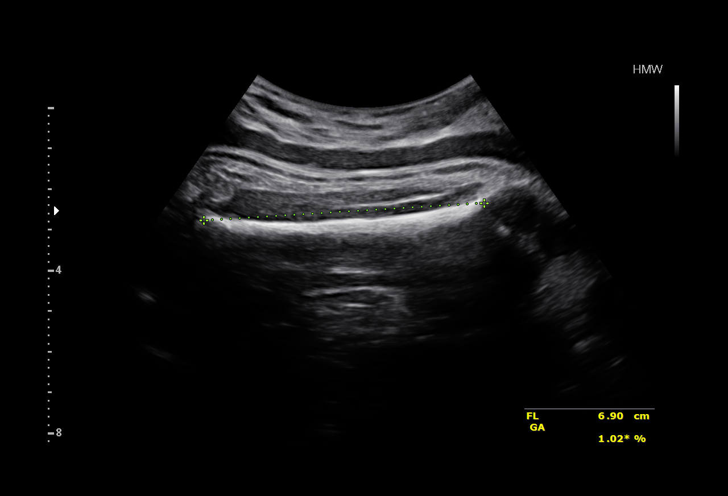
[im 31/31]
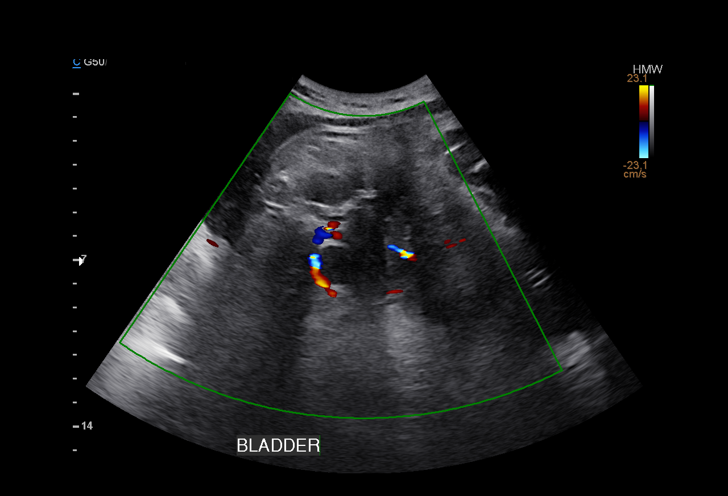

[14 of 28 positions shown; findings below may reference images not displayed]

OBSTETRICS REPORT
(Signed Final [DATE] [DATE])

Service(s) Provided

Indications

39 weeks gestation of pregnancy
Gestational diabetes in pregnancy, unspecified        [OE]
control
Size-Date Discrepancy                                 [OE]
Basic anatomic survey                                 Z36
No or Little Prenatal Care                            [OE]
Active labor
Fetal Evaluation

Num Of Fetuses:    1
Fetal Heart Rate:  129                          bpm
Cardiac Activity:  Observed
Presentation:      Cephalic
Placenta:          Anterior Fundal, above
cervical os
P. Cord            Visualized
Insertion:

Amniotic Fluid
AFI FV:      Subjectively within normal limits
AFI Sum:     16.97   cm       70  %Tile     Larg Pckt:    7.15  cm
RUQ:   7.15    cm   RLQ:    4.12   cm    LUQ:   3.14    cm   LLQ:    2.56   cm
Biometry

BPD:       88  mm     G. Age:  35w 4d                CI:        68.05   70 - 86
FL/HC:      20.5   20.6 -
23.4
HC:     341.2  mm     G. Age:  39w 2d       43  %    HC/AC:      0.91   0.87 -
1.06
AC:     373.1  mm     G. Age:  41w 1d     > 97  %    FL/BPD:     79.4   71 - 87
FL:      69.9  mm     G. Age:  35w 6d      < 3  %    FL/AC:      18.7   20 - 24
HUM:     63.2  mm     G. Age:  36w 5d       28  %

Est. FW:    [OE]  gm      8 lb 3 oz     87  %
Gestational Age
LMP:           44w 2d        Date:  [DATE]                 EDD:   [DATE]
Clinical EDD:  39w 1d                                        EDD:   [DATE]
U/S Today:     38w 0d                                        EDD:   [DATE]
Best:          39w 1d     Det. By:  Clinical EDD             EDD:   [DATE]
Anatomy

Cranium:          Appears normal         Aortic Arch:      Not well visualized
Fetal Cavum:      Appears normal         Ductal Arch:      Not well visualized
Ventricles:       Appears normal         Diaphragm:        Not well visualized
Choroid Plexus:   Not well visualized    Stomach:          Appears normal, left
sided
Cerebellum:       Not well visualized    Abdomen:          Appears normal
Posterior Fossa:  Not well visualized    Abdominal Wall:   Not well visualized
Nuchal Fold:      Not well visualized    Cord Vessels:     Not well visualized
Face:             Not well visualized    Kidneys:          Appear normal
Lips:             Not well visualized    Bladder:          Appears normal
Heart:            Appears normal         Spine:            Not well visualized
(4CH, axis, and
situs)
RVOT:             Not well visualized    Lower             Not well visualized
Extremities:
LVOT:             Not well visualized    Upper             Not well visualized
Extremities:

Other:  Technicallly difficult due to advanced GA, maternal habitus, fetal
postition, and active labor.
Cervix Uterus Adnexa

Cervix:       Not visualized (advanced GA >[OE])
Uterus:       No abnormality visualized.
Cul De Sac:   No free fluid seen.

Left Ovary:    Not visualized.
Right Ovary:   Not visualized.

Adnexa:     No abnormality visualized.
Impression

Single viable intrauterine pregnancy at [OE] by clinical EDD
Ultrasound age is consistent with stated EDD
Estimated fetal weight 3713g, 87th%ile
Cephalic presentation
AFI within normal limits
Complete anatomic survey unobtainable due to fetal position
and advanced gestation; however, all visualized structures
appear normal
Recommendations

Follow-up ultrasounds as clinically indicated.

questions or concerns.

## 2015-05-21 MED ORDER — LACTATED RINGERS IV SOLN
INTRAVENOUS | Status: DC
Start: 2015-05-21 — End: 2015-05-22
  Administered 2015-05-21 (×3): via INTRAVENOUS

## 2015-05-21 MED ORDER — LACTATED RINGERS IV BOLUS (SEPSIS): 500.0000 mL | Freq: Once | INTRAVENOUS | Status: DC

## 2015-05-21 MED ORDER — DIPHENHYDRAMINE HCL 50 MG/ML IJ SOLN: 12.5000 mg | INTRAMUSCULAR | Status: DC | PRN

## 2015-05-21 MED ORDER — BUPIVACAINE HCL (PF) 0.25 % IJ SOLN
INTRAMUSCULAR | Status: DC | PRN
  Administered 2015-05-21 (×2): 4 mL via EPIDURAL

## 2015-05-21 MED ORDER — VANCOMYCIN HCL IN DEXTROSE 1-5 GM/200ML-% IV SOLN
1000.0000 mg | Freq: Two times a day (BID) | INTRAVENOUS | Status: DC
  Administered 2015-05-21 (×2): 1000 mg via INTRAVENOUS
  Filled 2015-05-21 (×4): qty 200

## 2015-05-21 MED ORDER — LIDOCAINE HCL (PF) 1 % IJ SOLN
30.0000 mL | INTRAMUSCULAR | Status: DC | PRN
  Filled 2015-05-21: qty 30

## 2015-05-21 MED ORDER — LACTATED RINGERS IV SOLN
500.0000 mL | INTRAVENOUS | Status: DC | PRN
  Administered 2015-05-21 (×2): 500 mL via INTRAVENOUS

## 2015-05-21 MED ORDER — EPHEDRINE 5 MG/ML INJ: 10.0000 mg | INTRAVENOUS | Status: DC | PRN

## 2015-05-21 MED ORDER — ACETAMINOPHEN 325 MG PO TABS: 650.0000 mg | ORAL_TABLET | ORAL | Status: DC | PRN

## 2015-05-21 MED ORDER — OXYTOCIN 40 UNITS IN LACTATED RINGERS INFUSION - SIMPLE MED
1.0000 m[IU]/min | INTRAVENOUS | Status: DC
  Administered 2015-05-21: 2 m[IU]/min via INTRAVENOUS
  Filled 2015-05-21: qty 1000

## 2015-05-21 MED ORDER — TERBUTALINE SULFATE 1 MG/ML IJ SOLN: 0.2500 mg | Freq: Once | INTRAMUSCULAR | Status: DC | PRN

## 2015-05-21 MED ORDER — CITRIC ACID-SODIUM CITRATE 334-500 MG/5ML PO SOLN
30.0000 mL | ORAL | Status: DC | PRN
  Administered 2015-05-22: 30 mL via ORAL
  Filled 2015-05-21: qty 15

## 2015-05-21 MED ORDER — ONDANSETRON HCL 4 MG/2ML IJ SOLN: 4.0000 mg | Freq: Four times a day (QID) | INTRAMUSCULAR | Status: DC | PRN

## 2015-05-21 MED ORDER — FENTANYL 2.5 MCG/ML BUPIVACAINE 1/10 % EPIDURAL INFUSION (WH - ANES)
14.0000 mL/h | INTRAMUSCULAR | Status: DC | PRN
  Administered 2015-05-21 (×3): 14 mL/h via EPIDURAL
  Filled 2015-05-21 (×3): qty 125

## 2015-05-21 MED ORDER — OXYTOCIN BOLUS FROM INFUSION: 500.0000 mL | INTRAVENOUS | Status: DC

## 2015-05-21 MED ORDER — PHENYLEPHRINE 40 MCG/ML (10ML) SYRINGE FOR IV PUSH (FOR BLOOD PRESSURE SUPPORT)
80.0000 ug | PREFILLED_SYRINGE | INTRAVENOUS | Status: DC | PRN
  Filled 2015-05-21: qty 20

## 2015-05-21 MED ORDER — LIDOCAINE-EPINEPHRINE (PF) 2 %-1:200000 IJ SOLN
INTRAMUSCULAR | Status: DC | PRN
  Administered 2015-05-21: 4 mL

## 2015-05-21 MED ORDER — OXYTOCIN 40 UNITS IN LACTATED RINGERS INFUSION - SIMPLE MED: 62.5000 mL/h | INTRAVENOUS | Status: DC

## 2015-05-21 NOTE — Anesthesia Procedure Notes (Signed)
Epidural Patient location during procedure: OB  Staffing Anesthesiologist: MOSER, CHRISTOPHER Performed by: anesthesiologist   Preanesthetic Checklist Completed: patient identified, surgical consent, pre-op evaluation, timeout performed, IV checked, risks and benefits discussed and monitors and equipment checked  Epidural Patient position: sitting Prep: DuraPrep Patient monitoring: heart rate, cardiac monitor, continuous pulse ox and blood pressure Approach: midline Location: L3-L4 Injection technique: LOR saline  Needle:  Needle type: Tuohy  Needle gauge: 17 G Needle length: 9 cm Needle insertion depth: 8 cm Catheter type: closed end flexible Catheter size: 19 Gauge Catheter at skin depth: 14 cm Test dose: negative and 2% lidocaine with Epi 1:200 K  Assessment Events: blood not aspirated, injection not painful, no injection resistance, negative IV test and no paresthesia  Additional Notes Reason for block:procedure for pain   

## 2015-05-21 NOTE — H&P (Signed)
LABOR ADMISSION HISTORY AND PHYSICAL  Latoya Barr is a 24 y.o. female G1P0 with IUP at [redacted]w[redacted]d by LMP presenting for IOL for A2DM. She reports +FM, + contractions, No LOF, no VB, no blurry vision, headaches or peripheral edema, and RUQ pain.  She plans on breast feeding. She request the patch for birth control.  Dating: By LMP --->  Estimated Date of Delivery: 05/27/15  Sono:  None on record   Prenatal History/Complications:  Past Medical History: Past Medical History  Diagnosis Date  . Gestational diabetes     on medication    Past Surgical History: Past Surgical History  Procedure Laterality Date  . No past surgeries      Obstetrical History: OB History    Gravida Para Term Preterm AB TAB SAB Ectopic Multiple Living   1               Social History: Social History   Social History  . Marital Status: Single    Spouse Name: N/A  . Number of Children: N/A  . Years of Education: N/A   Social History Main Topics  . Smoking status: Never Smoker   . Smokeless tobacco: None  . Alcohol Use: No  . Drug Use: No  . Sexual Activity: Yes    Birth Control/ Protection: None   Other Topics Concern  . None   Social History Narrative    Family History: Family History  Problem Relation Age of Onset  . Diabetes Neg Hx   . Heart attack Neg Hx     Allergies: Allergies  Allergen Reactions  . Penicillins Hives    Blood pressure drops Has patient had a PCN reaction causing immediate rash, facial/tongue/throat swelling, SOB or lightheadedness with hypotension: No Has patient had a PCN reaction causing severe rash involving mucus membranes or skin necrosis: Yes Has patient had a PCN reaction that required hospitalization Yes Has patient had a PCN reaction occurring within the last 10 years: No If all of the above answers are "NO", then may proceed with Cephalosporin use.   Marland Kitchen Amoxicillin Hives    Blood pressure drops  . Ampicillin Hives    Blood pressure drops     Prescriptions prior to admission  Medication Sig Dispense Refill Last Dose  . glyBURIDE (DIABETA) 2.5 MG tablet Take 2.5 mg by mouth at bedtime.   05/20/2015 at Unknown time  . Prenatal Vit-Fe Fumarate-FA (PRENATAL MULTIVITAMIN) TABS tablet Take 1 tablet by mouth daily at 12 noon.   05/20/2015 at Unknown time  . [DISCONTINUED] cephALEXin (KEFLEX) 500 MG capsule Take 1 capsule (500 mg total) by mouth 4 (four) times daily. (Patient taking differently: Take 500 mg by mouth 4 (four) times daily. Started 9/2) 28 capsule 2      Review of Systems   All systems reviewed and negative except as stated in HPI  BP 117/72 mmHg  Pulse 95  Temp(Src) 98.6 F (37 C) (Oral)  Resp 16  Ht  (1.6 m)  Wt 217 lb (98.431 kg)  BMI 38.45 kg/m2  SpO2 100%  LMP 07/15/2014 General appearance: alert, cooperative and no distress Lungs: non-labored breathing Heart: regular rate  Abdomen: soft, non-tender Extremities: no sign of DVT, 1+ edema to mid-shin bilaterally Presentation: cephalic Fetal monitoringBaseline: 150 bpm, Variability: Fair (1-6 bpm) and Accelerations: Reactive Uterine activityFrequency: irregular, Duration: 50 seconds and Intensity: mild Dilation: 5 Effacement (%): 70 Station: -1, -2 Exam by:: LCarpenter, RN   Prenatal labs: ABO, Rh: --/--/O POS (09/27 1610)  Antibody: NEG (09/27 0307) Rubella:  pending RPR: Non Reactive (09/02 0930)  HBsAg:   pending HIV:   Nonreactive GBS:   positive in urine   Prenatal Transfer Tool  Maternal Diabetes: Yes:  Diabetes Type:  Insulin/Medication controlled Genetic Screening: Normal Maternal Ultrasounds/Referrals: Normal Fetal Ultrasounds or other Referrals:  None Maternal Substance Abuse:  No Significant Maternal Medications:  Meds include: Other: glyburide Significant Maternal Lab Results: Lab values include: Group B Strep positive  Results for orders placed or performed during the hospital encounter of 05/21/15 (from the past 24  hour(s))  Urinalysis, Routine w reflex microscopic (not at [facility])   Collection Time: 05/21/15  1:20 AM  Result Value Ref Range   Color, Urine YELLOW YELLOW   APPearance CLEAR CLEAR   Specific Gravity, Urine 1.010 1.005 - 1.030   pH 7.0 5.0 - 8.0   Glucose, UA NEGATIVE NEGATIVE mg/dL   Hgb urine dipstick LARGE (A) NEGATIVE   Bilirubin Urine NEGATIVE NEGATIVE   Ketones, ur 15 (A) NEGATIVE mg/dL   Protein, ur NEGATIVE NEGATIVE mg/dL   Urobilinogen, UA 0.2 0.0 - 1.0 mg/dL   Nitrite NEGATIVE NEGATIVE   Leukocytes, UA LARGE (A) NEGATIVE  Urine microscopic-add on   Collection Time: 05/21/15  1:20 AM  Result Value Ref Range   Squamous Epithelial / LPF RARE RARE   WBC, UA 11-20 <3 WBC/hpf   RBC / HPF 7-10 <3 RBC/hpf   Bacteria, UA FEW (A) RARE  CBC   Collection Time: 05/21/15  3:07 AM  Result Value Ref Range   WBC 8.8 4.0 - 10.5 K/uL   RBC 3.86 (L) 3.87 - 5.11 MIL/uL   Hemoglobin 11.0 (L) 12.0 - 15.0 g/dL   HCT 52.8 (L) 41.3 - 24.4 %   MCV 85.0 78.0 - 100.0 fL   MCH 28.5 26.0 - 34.0 pg   MCHC 33.5 30.0 - 36.0 g/dL   RDW 01.0 (H) 27.2 - 53.6 %   Platelets 296 150 - 400 K/uL  Differential   Collection Time: 05/21/15  3:07 AM  Result Value Ref Range   Neutrophils Relative % 69 %   Neutro Abs 6.0 1.7 - 7.7 K/uL   Lymphocytes Relative 20 %   Lymphs Abs 1.8 0.7 - 4.0 K/uL   Monocytes Relative 11 %   Monocytes Absolute 1.0 0.1 - 1.0 K/uL   Eosinophils Relative 0 %   Eosinophils Absolute 0.0 0.0 - 0.7 K/uL   Basophils Relative 0 %   Basophils Absolute 0.0 0.0 - 0.1 K/uL  Type and screen   Collection Time: 05/21/15  3:07 AM  Result Value Ref Range   ABO/RH(D) O POS    Antibody Screen NEG    Sample Expiration 05/24/2015     Patient Active Problem List   Diagnosis Date Noted  . Gestational diabetes mellitus, class A2 05/21/2015  . Gestational diabetes mellitus, antepartum 04/26/2015    Assessment: Latoya Barr is a 24 y.o. G1P0 at [redacted]w[redacted]d here in latent labor, admitting as is  at 39 weeks and has A2DM. Also with scant prenatal care.  #Labor: expectant for now #A2GDM: glucose q4 in latent, q2 in active. Ultrasound to estimate fetal weight #Scant PNC: requesting records, have collected prenatal profile #Pain: Epidural #FWB: Category 1 #ID:  GBS pos urine - vanc give hx hives with penicillins #MOF: Breast #MOC:Patch  Tarri Abernethy, MD PGY-1 [facility]

## 2015-05-21 NOTE — Progress Notes (Signed)
Latoya Barr is a 24 y.o. G1P0 at [redacted]w[redacted]d admitted for induction of labor due to Gestational diabetes.  Subjective:   Objective: BP 123/93 mmHg  Pulse 97  Temp(Src) 98.2 F (36.8 C) (Oral)  Resp 18  Ht  (1.6 m)  Wt 217 lb (98.431 kg)  BMI 38.45 kg/m2  SpO2 100%  LMP 07/15/2014      FHT:  FHR: 150 bpm, variability: minimal ,  accelerations:  Present,  decelerations:  Absent UC:   irregular, every 1-5 minutes SVE:   Dilation: 9 (BBOW) Effacement (%): 80 Station: -2 Exam by:: Ivonne Andrew, CNM  Labs: Lab Results  Component Value Date   WBC 8.8 05/21/2015   HGB 11.0* 05/21/2015   HCT 32.8* 05/21/2015   MCV 85.0 05/21/2015   PLT 296 05/21/2015    Assessment / Plan: Augmentation of labor, progressing well  AROM with clear fluid  Fetal Wellbeing:  Category II Pain Control:  Epidural Anticipated MOD:  NSVD  Latoya Barr 05/21/2015, 5:16 PM

## 2015-05-21 NOTE — MAU Note (Signed)
Report called to [facility] on BS. Will go to 164 after u/s

## 2015-05-21 NOTE — MAU Provider Note (Signed)
  History     CSN: [id number]  Arrival date and time: 05/21/15 0115   First Provider Initiated Contact with Patient 05/21/15 0222      Chief Complaint  Patient presents with  . Labor Eval   HPI  Patient presents with spotting and cramping.   See H&P from same date for full report  Past Medical History  Diagnosis Date  . Gestational diabetes     on medication    Past Surgical History  Procedure Laterality Date  . No past surgeries      Family History  Problem Relation Age of Onset  . Diabetes Neg Hx   . Heart attack Neg Hx     Social History  Substance Use Topics  . Smoking status: Never Smoker   . Smokeless tobacco: None  . Alcohol Use: No    Allergies:  Allergies  Allergen Reactions  . Penicillins Hives    Blood pressure drops Has patient had a PCN reaction causing immediate rash, facial/tongue/throat swelling, SOB or lightheadedness with hypotension: No Has patient had a PCN reaction causing severe rash involving mucus membranes or skin necrosis: Yes Has patient had a PCN reaction that required hospitalization Yes Has patient had a PCN reaction occurring within the last 10 years: No If all of the above answers are "NO", then may proceed with Cephalosporin use.   Marland Kitchen Amoxicillin Hives    Blood pressure drops  . Ampicillin Hives    Blood pressure drops    Prescriptions prior to admission  Medication Sig Dispense Refill Last Dose  . glyBURIDE (DIABETA) 2.5 MG tablet Take 2.5 mg by mouth at bedtime.   05/20/2015 at Unknown time  . Prenatal Vit-Fe Fumarate-FA (PRENATAL MULTIVITAMIN) TABS tablet Take 1 tablet by mouth daily at 12 noon.   05/20/2015 at Unknown time  . [DISCONTINUED] cephALEXin (KEFLEX) 500 MG capsule Take 1 capsule (500 mg total) by mouth 4 (four) times daily. (Patient taking differently: Take 500 mg by mouth 4 (four) times daily. Started 9/2) 28 capsule 2     ROS Physical Exam   Blood pressure 117/72, pulse 95, temperature 98.6 F (37 C),  temperature source Oral, resp. rate 16, height  (1.6 m), weight 217 lb (98.431 kg), last menstrual period 07/15/2014, SpO2 100 %.  Physical Exam  MAU Course  Procedures None  Assessment and Plan   A: Patient is 24 y.o. G1P0 [redacted]w[redacted]d reporting spotting and cramping.    P: Admit to L&D See H&P from same date for full report   Tarri Abernethy, MD PGY-1 [facility]  05/21/2015, 6:43 AM   OB FELLOW HISTORY AND PHYSICAL ATTESTATION  I have seen and examined this patient; I agree with above documentation in the resident's note.  Cherrie Gauze Wouk 05/21/2015, 7:35 AM

## 2015-05-21 NOTE — Anesthesia Preprocedure Evaluation (Addendum)
Anesthesia Evaluation  Patient identified by MRN, date of birth, ID band Patient awake    Reviewed: Allergy & Precautions, NPO status , Patient's Chart, lab work & pertinent test results  History of Anesthesia Complications Negative for: history of anesthetic complications  Airway Mallampati: II  TM Distance: >3 FB Neck ROM: Full    Dental  (+) Teeth Intact   Pulmonary neg pulmonary ROS,    breath sounds clear to auscultation       Cardiovascular negative cardio ROS   Rhythm:Regular     Neuro/Psych negative neurological ROS  negative psych ROS   GI/Hepatic negative GI ROS, Neg liver ROS,   Endo/Other  diabetes  Renal/GU negative Renal ROS     Musculoskeletal   Abdominal   Peds  Hematology  (+) anemia ,   Anesthesia Other Findings   Reproductive/Obstetrics (+) Pregnancy                            Anesthesia Physical Anesthesia Plan  ASA: II  Anesthesia Plan: Epidural   Post-op Pain Management:    Induction:   Airway Management Planned:   Additional Equipment:   Intra-op Plan:   Post-operative Plan:   Informed Consent: I have reviewed the patients History and Physical, chart, labs and discussed the procedure including the risks, benefits and alternatives for the proposed anesthesia with the patient or authorized representative who has indicated his/her understanding and acceptance.     Plan Discussed with: Anesthesiologist, CRNA and Surgeon  Anesthesia Plan Comments: (For C/S with epidural in place.)       Anesthesia Quick Evaluation

## 2015-05-21 NOTE — Progress Notes (Addendum)
Patient ID: Latoya Barr, female   DOB: 05/17/1991, 24 y.o.   MRN: [id number] Latoya Barr is a 24 y.o. G1P0 at [redacted]w[redacted]d.  Subjective: Mild pressure  Objective: BP 130/74 mmHg  Pulse 109  Temp(Src) 98.2 F (36.8 C) (Oral)  Resp 18  Ht  (1.6 m)  Wt 217 lb (98.431 kg)  BMI 38.45 kg/m2  SpO2 100%  LMP 07/15/2014   FHT:  FHR: 155 bpm, variability: min,  accelerations:  5x5,  decelerations:  few mild variables UC:   Q 2-4 minutes, strong  Dilation: 9 (BBOW) Effacement (%): 80 Cervical Position: Middle Station: -2 Presentation: Vertex Exam by:: Latoya Barr, CNM  Labs: Results for orders placed or performed during the hospital encounter of 05/21/15 (from the past 24 hour(s))  Urinalysis, Routine w reflex microscopic (not at [facility])     Status: Abnormal   Collection Time: 05/21/15  1:20 AM  Result Value Ref Range   Color, Urine YELLOW YELLOW   APPearance CLEAR CLEAR   Specific Gravity, Urine 1.010 1.005 - 1.030   pH 7.0 5.0 - 8.0   Glucose, UA NEGATIVE NEGATIVE mg/dL   Hgb urine dipstick LARGE (A) NEGATIVE   Bilirubin Urine NEGATIVE NEGATIVE   Ketones, ur 15 (A) NEGATIVE mg/dL   Protein, ur NEGATIVE NEGATIVE mg/dL   Urobilinogen, UA 0.2 0.0 - 1.0 mg/dL   Nitrite NEGATIVE NEGATIVE   Leukocytes, UA LARGE (A) NEGATIVE  Urine microscopic-add on     Status: Abnormal   Collection Time: 05/21/15  1:20 AM  Result Value Ref Range   Squamous Epithelial / LPF RARE RARE   WBC, UA 11-20 <3 WBC/hpf   RBC / HPF 7-10 <3 RBC/hpf   Bacteria, UA FEW (A) RARE  CBC     Status: Abnormal   Collection Time: 05/21/15  3:07 AM  Result Value Ref Range   WBC 8.8 4.0 - 10.5 K/uL   RBC 3.86 (L) 3.87 - 5.11 MIL/uL   Hemoglobin 11.0 (L) 12.0 - 15.0 g/dL   HCT 40.9 (L) 81.1 - 91.4 %   MCV 85.0 78.0 - 100.0 fL   MCH 28.5 26.0 - 34.0 pg   MCHC 33.5 30.0 - 36.0 g/dL   RDW 78.2 (H) 95.6 - 21.3 %   Platelets 296 150 - 400 K/uL  Differential     Status: None   Collection Time: 05/21/15  3:07 AM  Result  Value Ref Range   Neutrophils Relative % 69 %   Neutro Abs 6.0 1.7 - 7.7 K/uL   Lymphocytes Relative 20 %   Lymphs Abs 1.8 0.7 - 4.0 K/uL   Monocytes Relative 11 %   Monocytes Absolute 1.0 0.1 - 1.0 K/uL   Eosinophils Relative 0 %   Eosinophils Absolute 0.0 0.0 - 0.7 K/uL   Basophils Relative 0 %   Basophils Absolute 0.0 0.0 - 0.1 K/uL  Type and screen     Status: None   Collection Time: 05/21/15  3:07 AM  Result Value Ref Range   ABO/RH(D) O POS    Antibody Screen NEG    Sample Expiration 05/24/2015   ABO/Rh     Status: None   Collection Time: 05/21/15  3:07 AM  Result Value Ref Range   ABO/RH(D) O POS   Glucose, capillary     Status: Abnormal   Collection Time: 05/21/15  7:17 AM  Result Value Ref Range   Glucose-Capillary 106 (H) 65 - 99 mg/dL  Glucose, capillary     Status: None  Collection Time: 05/21/15 11:12 AM  Result Value Ref Range   Glucose-Capillary 97 65 - 99 mg/dL   Comment 1 Notify RN    Comment 2 Document in Chart   Glucose, capillary     Status: None   Collection Time: 05/21/15  3:16 PM  Result Value Ref Range   Glucose-Capillary 91 65 - 99 mg/dL    Assessment / Plan: [redacted]w[redacted]d week IUP Labor: transition Fetal Wellbeing:  Category II Pain Control:  epidural Anticipated MOD:  Unsure. Concerned that vertex is still out of pelvis although she has made adequate cervical change. Cannot AROM safely due to high station. Also concerned about decreased variability. Will discuss w /Dr. Jolayne Panther.   [place], [place] 05/21/2015 4:54 PM

## 2015-05-21 NOTE — Progress Notes (Signed)
Labor Progress Note  S: Patient sleeping, awoke when entered room. Comfortable. No complaints  O:  BP 97/79 mmHg  Pulse 73  Temp(Src) 98.8 F (37.1 C) (Oral)  Resp 18  Ht  (1.6 m)  Wt 217 lb (98.431 kg)  BMI 38.45 kg/m2  SpO2 100%  LMP 07/15/2014 EFM: FHR: 140, min-mod variability, +accels, no decels; toco-contractions q2-57min BJY:[id number]: 5 Effacement (%): 70 Cervical Position: Middle Station: -2 Presentation: Vertex Exam by:: Camelia Eng, RN   A&P: 24 y.o. G1P0 [redacted]w[redacted]d with SOL at term #Labor: Continue augmentation with pitocin.  #FWB: Category 2 tracing #GBS: positive, on vancomycin  Durenda Hurt, DO Resident Physician 11:16 AM

## 2015-05-22 ENCOUNTER — Encounter (HOSPITAL_COMMUNITY): Payer: Self-pay

## 2015-05-22 ENCOUNTER — Encounter (HOSPITAL_COMMUNITY)
Admission: AD | Disposition: A | Discharge status: Home / Self Care | Payer: Self-pay | Source: Ambulatory Visit | Attending: Family Medicine

## 2015-05-22 DIAGNOSIS — O24429 Gestational diabetes mellitus in childbirth, unspecified control: Secondary | ICD-10-CM

## 2015-05-22 DIAGNOSIS — O99824 Streptococcus B carrier state complicating childbirth: Secondary | ICD-10-CM

## 2015-05-22 DIAGNOSIS — Z3A39 39 weeks gestation of pregnancy: Secondary | ICD-10-CM

## 2015-05-22 DIAGNOSIS — O0933 Supervision of pregnancy with insufficient antenatal care, third trimester: Secondary | ICD-10-CM

## 2015-05-22 DIAGNOSIS — Z6838 Body mass index (BMI) 38.0-38.9, adult: Secondary | ICD-10-CM

## 2015-05-22 LAB — CBC
HEMATOCRIT: 31.5 % — AB (ref 36.0–46.0)
HEMOGLOBIN: 10.3 g/dL — AB (ref 12.0–15.0)
MCH: 27.8 pg (ref 26.0–34.0)
MCHC: 32.7 g/dL (ref 30.0–36.0)
MCV: 85.1 fL (ref 78.0–100.0)
Platelets: 262 10*3/uL (ref 150–400)
RBC: 3.7 MIL/uL — AB (ref 3.87–5.11)
RDW: 16.3 % — ABNORMAL HIGH (ref 11.5–15.5)
WBC: 15.4 10*3/uL — ABNORMAL HIGH (ref 4.0–10.5)

## 2015-05-22 LAB — GLUCOSE, CAPILLARY
GLUCOSE-CAPILLARY: 115 mg/dL — AB (ref 65–99)
GLUCOSE-CAPILLARY: 123 mg/dL — AB (ref 65–99)
GLUCOSE-CAPILLARY: 109 mg/dL — AB (ref 65–99)
GLUCOSE-CAPILLARY: 117 mg/dL — AB (ref 65–99)
Glucose-Capillary: 140 mg/dL — ABNORMAL HIGH (ref 65–99)

## 2015-05-22 LAB — RUBELLA SCREEN: Rubella: 1.85 index (ref >0.99)

## 2015-05-22 LAB — HIV ANTIBODY (ROUTINE TESTING W REFLEX): HIV Screen 4th Generation wRfx: NONREACTIVE

## 2015-05-22 LAB — HEPATITIS B SURFACE ANTIGEN: Hepatitis B Surface Ag: NEGATIVE

## 2015-05-22 LAB — RPR: RPR: NONREACTIVE

## 2015-05-22 SURGERY — Surgical Case
Anesthesia: Epidural | Site: Abdomen

## 2015-05-22 MED ORDER — NALBUPHINE HCL 10 MG/ML IJ SOLN
5.0000 mg | Freq: Once | INTRAMUSCULAR | Status: DC | PRN
  Filled 2015-05-22: qty 0.5

## 2015-05-22 MED ORDER — DEXTROSE 5 % IV SOLN: INTRAVENOUS | Status: DC | PRN

## 2015-05-22 MED ORDER — SIMETHICONE 80 MG PO CHEW
80.0000 mg | CHEWABLE_TABLET | ORAL | Status: DC
  Administered 2015-05-22 – 2015-05-23 (×2): 80 mg via ORAL
  Filled 2015-05-22 (×3): qty 1

## 2015-05-22 MED ORDER — SODIUM BICARBONATE 8.4 % IV SOLN
INTRAVENOUS | Status: AC
  Filled 2015-05-22: qty 50

## 2015-05-22 MED ORDER — DIPHENHYDRAMINE HCL 50 MG/ML IJ SOLN: 12.5000 mg | INTRAMUSCULAR | Status: DC | PRN

## 2015-05-22 MED ORDER — GENTAMICIN SULFATE 40 MG/ML IJ SOLN
INTRAMUSCULAR | Status: DC
  Filled 2015-05-22: qty 8.75

## 2015-05-22 MED ORDER — MEPERIDINE HCL 25 MG/ML IJ SOLN: 6.2500 mg | INTRAMUSCULAR | Status: DC | PRN

## 2015-05-22 MED ORDER — SODIUM BICARBONATE 8.4 % IV SOLN
INTRAVENOUS | Status: DC | PRN
  Administered 2015-05-22 (×2): 5 mL via EPIDURAL
  Administered 2015-05-22: 8 mL via EPIDURAL
  Administered 2015-05-22 (×2): 5 mL via EPIDURAL

## 2015-05-22 MED ORDER — KETOROLAC TROMETHAMINE 30 MG/ML IJ SOLN: 30.0000 mg | Freq: Four times a day (QID) | INTRAMUSCULAR | Status: AC | PRN

## 2015-05-22 MED ORDER — SODIUM CHLORIDE 0.9 % IR SOLN
Status: DC | PRN
  Administered 2015-05-22: 1000 mL

## 2015-05-22 MED ORDER — NALOXONE HCL 0.4 MG/ML IJ SOLN: 0.4000 mg | INTRAMUSCULAR | Status: DC | PRN

## 2015-05-22 MED ORDER — SCOPOLAMINE 1 MG/3DAYS TD PT72
MEDICATED_PATCH | TRANSDERMAL | Status: DC | PRN
  Administered 2015-05-22: 1 via TRANSDERMAL

## 2015-05-22 MED ORDER — NALBUPHINE HCL 10 MG/ML IJ SOLN
5.0000 mg | INTRAMUSCULAR | Status: DC | PRN
  Filled 2015-05-22: qty 0.5

## 2015-05-22 MED ORDER — TETANUS-DIPHTH-ACELL PERTUSSIS 5-2.5-18.5 LF-MCG/0.5 IM SUSP: 0.5000 mL | Freq: Once | INTRAMUSCULAR | Status: DC

## 2015-05-22 MED ORDER — MENTHOL 3 MG MT LOZG: 1.0000 | LOZENGE | OROMUCOSAL | Status: DC | PRN

## 2015-05-22 MED ORDER — ACETAMINOPHEN 325 MG PO TABS: 650.0000 mg | ORAL_TABLET | ORAL | Status: DC | PRN

## 2015-05-22 MED ORDER — OXYCODONE-ACETAMINOPHEN 5-325 MG PO TABS
1.0000 | ORAL_TABLET | ORAL | Status: DC | PRN
  Administered 2015-05-23 (×2): 1 via ORAL
  Administered 2015-05-24: 0.5 via ORAL
  Administered 2015-05-24 – 2015-05-25 (×3): 1 via ORAL
  Filled 2015-05-22 (×6): qty 1

## 2015-05-22 MED ORDER — HYDROMORPHONE HCL 1 MG/ML IJ SOLN
INTRAMUSCULAR | Status: DC | PRN
  Administered 2015-05-22: 1 mg via INTRAVENOUS

## 2015-05-22 MED ORDER — OXYTOCIN 10 UNIT/ML IJ SOLN
INTRAMUSCULAR | Status: AC
  Filled 2015-05-22: qty 4

## 2015-05-22 MED ORDER — CLINDAMYCIN PHOSPHATE 900 MG/6ML IJ SOLN
INTRAMUSCULAR | Status: AC
Start: 2015-05-22 — End: 2015-05-22
  Administered 2015-05-22: 114.75 mL via INTRAVENOUS

## 2015-05-22 MED ORDER — LACTATED RINGERS IV SOLN
INTRAVENOUS | Status: DC | PRN
  Administered 2015-05-22 (×4): via INTRAVENOUS

## 2015-05-22 MED ORDER — SCOPOLAMINE 1 MG/3DAYS TD PT72
MEDICATED_PATCH | TRANSDERMAL | Status: AC
  Filled 2015-05-22: qty 1

## 2015-05-22 MED ORDER — IBUPROFEN 600 MG PO TABS: 600.0000 mg | ORAL_TABLET | Freq: Four times a day (QID) | ORAL | Status: DC | PRN

## 2015-05-22 MED ORDER — DIPHENHYDRAMINE HCL 25 MG PO CAPS: 25.0000 mg | ORAL_CAPSULE | ORAL | Status: DC | PRN

## 2015-05-22 MED ORDER — WITCH HAZEL-GLYCERIN EX PADS: 1.0000 "application " | MEDICATED_PAD | CUTANEOUS | Status: DC | PRN

## 2015-05-22 MED ORDER — ACETAMINOPHEN 500 MG PO TABS
1000.0000 mg | ORAL_TABLET | Freq: Four times a day (QID) | ORAL | Status: AC
  Administered 2015-05-22 (×3): 1000 mg via ORAL
  Filled 2015-05-22 (×3): qty 2

## 2015-05-22 MED ORDER — SCOPOLAMINE 1 MG/3DAYS TD PT72
1.0000 | MEDICATED_PATCH | Freq: Once | TRANSDERMAL | Status: DC
  Filled 2015-05-22: qty 1

## 2015-05-22 MED ORDER — OXYCODONE-ACETAMINOPHEN 5-325 MG PO TABS
2.0000 | ORAL_TABLET | ORAL | Status: DC | PRN
  Administered 2015-05-24 – 2015-05-25 (×3): 2 via ORAL
  Filled 2015-05-22 (×3): qty 2

## 2015-05-22 MED ORDER — OXYTOCIN 40 UNITS IN LACTATED RINGERS INFUSION - SIMPLE MED: 62.5000 mL/h | INTRAVENOUS | Status: AC

## 2015-05-22 MED ORDER — SIMETHICONE 80 MG PO CHEW
80.0000 mg | CHEWABLE_TABLET | ORAL | Status: DC | PRN
  Administered 2015-05-24: 80 mg via ORAL

## 2015-05-22 MED ORDER — ONDANSETRON HCL 4 MG/2ML IJ SOLN: 4.0000 mg | Freq: Three times a day (TID) | INTRAMUSCULAR | Status: DC | PRN

## 2015-05-22 MED ORDER — DIPHENHYDRAMINE HCL 25 MG PO CAPS: 25.0000 mg | ORAL_CAPSULE | Freq: Four times a day (QID) | ORAL | Status: DC | PRN

## 2015-05-22 MED ORDER — SIMETHICONE 80 MG PO CHEW
80.0000 mg | CHEWABLE_TABLET | Freq: Three times a day (TID) | ORAL | Status: DC
Start: 2015-05-22 — End: 2015-05-25
  Administered 2015-05-22 – 2015-05-24 (×7): 80 mg via ORAL
  Filled 2015-05-22 (×7): qty 1

## 2015-05-22 MED ORDER — NALBUPHINE HCL 10 MG/ML IJ SOLN
5.0000 mg | INTRAMUSCULAR | Status: DC | PRN
  Administered 2015-05-22: 5 mg via INTRAVENOUS
  Filled 2015-05-22 (×3): qty 0.5

## 2015-05-22 MED ORDER — OXYTOCIN 10 UNIT/ML IJ SOLN
40.0000 [IU] | INTRAVENOUS | Status: DC | PRN
  Administered 2015-05-22: 40 [IU] via INTRAVENOUS

## 2015-05-22 MED ORDER — GENTAMICIN SULFATE 40 MG/ML IJ SOLN: INTRAVENOUS | Status: DC | PRN

## 2015-05-22 MED ORDER — LIDOCAINE-EPINEPHRINE (PF) 2 %-1:200000 IJ SOLN
INTRAMUSCULAR | Status: AC
  Filled 2015-05-22: qty 20

## 2015-05-22 MED ORDER — HYDROMORPHONE HCL 1 MG/ML IJ SOLN: 0.2500 mg | INTRAMUSCULAR | Status: DC | PRN

## 2015-05-22 MED ORDER — PROMETHAZINE HCL 25 MG/ML IJ SOLN: 6.2500 mg | INTRAMUSCULAR | Status: DC | PRN

## 2015-05-22 MED ORDER — SENNOSIDES-DOCUSATE SODIUM 8.6-50 MG PO TABS
2.0000 | ORAL_TABLET | ORAL | Status: DC
  Administered 2015-05-22 – 2015-05-24 (×3): 2 via ORAL
  Filled 2015-05-22 (×3): qty 2

## 2015-05-22 MED ORDER — KETOROLAC TROMETHAMINE 30 MG/ML IJ SOLN: 30.0000 mg | Freq: Once | INTRAMUSCULAR | Status: DC | PRN

## 2015-05-22 MED ORDER — NALOXONE HCL 1 MG/ML IJ SOLN
1.0000 ug/kg/h | INTRAMUSCULAR | Status: DC | PRN
  Filled 2015-05-22: qty 2

## 2015-05-22 MED ORDER — ONDANSETRON HCL 4 MG/2ML IJ SOLN
INTRAMUSCULAR | Status: DC | PRN
  Administered 2015-05-22: 4 mg via INTRAVENOUS

## 2015-05-22 MED ORDER — LACTATED RINGERS IV SOLN: INTRAVENOUS | Status: DC

## 2015-05-22 MED ORDER — LANOLIN HYDROUS EX OINT: 1.0000 "application " | TOPICAL_OINTMENT | CUTANEOUS | Status: DC | PRN

## 2015-05-22 MED ORDER — MORPHINE SULFATE (PF) 0.5 MG/ML IJ SOLN
INTRAMUSCULAR | Status: AC
  Filled 2015-05-22: qty 100

## 2015-05-22 MED ORDER — HYDROMORPHONE HCL 1 MG/ML IJ SOLN
INTRAMUSCULAR | Status: AC
  Filled 2015-05-22: qty 1

## 2015-05-22 MED ORDER — IBUPROFEN 600 MG PO TABS
600.0000 mg | ORAL_TABLET | Freq: Four times a day (QID) | ORAL | Status: DC
  Administered 2015-05-22 – 2015-05-25 (×13): 600 mg via ORAL
  Filled 2015-05-22 (×13): qty 1

## 2015-05-22 MED ORDER — SODIUM CHLORIDE 0.9 % IJ SOLN: 3.0000 mL | INTRAMUSCULAR | Status: DC | PRN

## 2015-05-22 MED ORDER — DIBUCAINE 1 % RE OINT: 1.0000 "application " | TOPICAL_OINTMENT | RECTAL | Status: DC | PRN

## 2015-05-22 MED ORDER — MORPHINE SULFATE (PF) 0.5 MG/ML IJ SOLN
INTRAMUSCULAR | Status: DC | PRN
  Administered 2015-05-22: 4 mg via EPIDURAL

## 2015-05-22 MED ORDER — ONDANSETRON HCL 4 MG/2ML IJ SOLN
INTRAMUSCULAR | Status: AC
  Filled 2015-05-22: qty 2

## 2015-05-22 MED ORDER — PRENATAL MULTIVITAMIN CH
1.0000 | ORAL_TABLET | Freq: Every day | ORAL | Status: DC
  Administered 2015-05-22 – 2015-05-24 (×3): 1 via ORAL
  Filled 2015-05-22 (×4): qty 1

## 2015-05-22 SURGICAL SUPPLY — 27 items
CLAMP CORD UMBIL (MISCELLANEOUS) ×2 IMPLANT
CONTAINER PREFILL 10% NBF 15ML (MISCELLANEOUS) IMPLANT
DRAPE SHEET LG 3/4 BI-LAMINATE (DRAPES) ×2 IMPLANT
DRSG OPSITE POSTOP 4X10 (GAUZE/BANDAGES/DRESSINGS) ×2 IMPLANT
DURAPREP 26ML APPLICATOR (WOUND CARE) ×2 IMPLANT
ELECT REM PT RETURN 9FT ADLT (ELECTROSURGICAL) ×2
ELECTRODE REM PT RTRN 9FT ADLT (ELECTROSURGICAL) ×1 IMPLANT
EXTRACTOR VACUUM M CUP 4 TUBE (SUCTIONS) ×2 IMPLANT
GAUZE SPONGE 4X4 12PLY STRL (GAUZE/BANDAGES/DRESSINGS) ×2 IMPLANT
GLOVE BIOGEL PI IND STRL 6.5 (GLOVE) ×1 IMPLANT
GLOVE BIOGEL PI INDICATOR 6.5 (GLOVE) ×1
GLOVE SURG SS PI 6.0 STRL IVOR (GLOVE) ×2 IMPLANT
GOWN STRL REUS W/TWL LRG LVL3 (GOWN DISPOSABLE) ×4 IMPLANT
KIT ABG SYR 3ML LUER SLIP (SYRINGE) ×2 IMPLANT
NEEDLE HYPO 25X5/8 SAFETYGLIDE (NEEDLE) ×2 IMPLANT
NS IRRIG 1000ML POUR BTL (IV SOLUTION) ×2 IMPLANT
PACK C SECTION WH (CUSTOM PROCEDURE TRAY) ×2 IMPLANT
PAD ABD 8X7 1/2 STERILE (GAUZE/BANDAGES/DRESSINGS) ×2 IMPLANT
PAD OB MATERNITY 4.3X12.25 (PERSONAL CARE ITEMS) ×2 IMPLANT
PENCIL SMOKE EVAC W/HOLSTER (ELECTROSURGICAL) ×4 IMPLANT
RTRCTR C-SECT PINK 25CM LRG (MISCELLANEOUS) ×2 IMPLANT
SEPRAFILM MEMBRANE 5X6 (MISCELLANEOUS) IMPLANT
SUT PLAIN 0 NONE (SUTURE) IMPLANT
SUT VIC AB 0 CT1 36 (SUTURE) ×8 IMPLANT
SUT VIC AB 4-0 KS 27 (SUTURE) ×2 IMPLANT
TOWEL OR 17X24 6PK STRL BLUE (TOWEL DISPOSABLE) ×2 IMPLANT
TRAY FOLEY CATH SILVER 14FR (SET/KITS/TRAYS/PACK) ×2 IMPLANT

## 2015-05-22 NOTE — Progress Notes (Signed)
Patient has been pushing for a total of 3 hours without fetal decent past 0 station. Fetal heart tracing with persistent minimal variability. Discussed with the patient need to proceed for primary cesarean delivery. Risks, benefits and alternatives were explained including but not limited to risks of bleeding, infection and damage to adjacent organs. Patient verbalized understanding and all questions were answered. Consent signed and OR notified

## 2015-05-22 NOTE — Lactation Note (Signed)
This note was copied from the chart of Latoya Lashawnta Wolfrey. Lactation Consultation Note  Attempted consult.  Mother is tired and desires formula. Offered hand expression but she declined.  She also desires an artificial nipple.  Risks of that and formula reviewed.  Patient Name: Latoya Barr [id number] Date: 05/22/2015 Reason for consult: Initial assessment   Maternal Data Has patient been taught Hand Expression?: Yes (earlier by RN per PT)  Feeding Feeding Type: Breast Fed Length of feed: 25 min  LATCH Score/Interventions                      Lactation Tools Discussed/Used     Consult Status      Soyla Dryer 05/22/2015, 8:37 AM

## 2015-05-22 NOTE — Anesthesia Postprocedure Evaluation (Signed)
Anesthesia Post Note  Patient: Latoya Barr  Procedure(s) Performed: Procedure(s) (LRB): CESAREAN SECTION (N/A)  Anesthesia type: Epidural  Patient location: PACU  Post pain: Pain level controlled  Post assessment: Post-op Vital signs reviewed  Last Vitals:  Filed Vitals:   05/22/15 0345  BP: 147/81  Pulse: 105  Temp:   Resp: 22    Post vital signs: Reviewed  Level of consciousness: awake  Complications: No apparent anesthesia complications

## 2015-05-22 NOTE — Op Note (Signed)
Latoya Barr PROCEDURE DATE: 05/22/2015  PREOPERATIVE DIAGNOSIS: Intrauterine pregnancy at  [redacted]w[redacted]d weeks gestation; failure to progress: arrest of descent  POSTOPERATIVE DIAGNOSIS: The same  PROCEDURE:     Cesarean Section  SURGEON:  Dr. Gigi Gin Constant  ASSISTANT: none  INDICATIONS: Latoya Barr is a 24 y.o. G1P0 at [redacted]w[redacted]d scheduled for cesarean section secondary to failure to progress: arrest of descent.  The risks of cesarean section discussed with the patient included but were not limited to: bleeding which may require transfusion or reoperation; infection which may require antibiotics; injury to bowel, bladder, ureters or other surrounding organs; injury to the fetus; need for additional procedures including hysterectomy in the event of a life-threatening hemorrhage; placental abnormalities wth subsequent pregnancies, incisional problems, thromboembolic phenomenon and other postoperative/anesthesia complications. The patient concurred with the proposed plan, giving informed written consent for the procedure.    FINDINGS:  Viable female infant in cephalic presentation.  Apgars 8 and 9.  Clear amniotic fluid.  Intact placenta, three vessel cord.  Normal uterus, fallopian tubes and ovaries bilaterally.  ANESTHESIA:    Spinal INTRAVENOUS FLUIDS:2800 ml ESTIMATED BLOOD LOSS: 1000 ml URINE OUTPUT:  200 ml SPECIMENS: Placenta sent to L&D COMPLICATIONS: None immediate  PROCEDURE IN DETAIL:  The patient received intravenous antibiotics and had sequential compression devices applied to her lower extremities while in the preoperative area.  She was then taken to the operating room where anesthesia was induced and was found to be adequate. A foley catheter was placed into her bladder and attached to constant gravity. She was then placed in a dorsal supine position with a leftward tilt, and prepped and draped in a sterile manner. After an adequate timeout was performed, a Pfannenstiel skin incision was  made with scalpel and carried through to the underlying layer of fascia. The fascia was incised in the midline and this incision was extended bilaterally using the Mayo scissors. Kocher clamps were applied to the superior aspect of the fascial incision and the underlying rectus muscles were dissected off bluntly. A similar process was carried out on the inferior aspect of the facial incision. The rectus muscles were separated in the midline bluntly and the peritoneum was entered bluntly. The Alexis self-retaining retractor was introduced into the abdominal cavity. Attention was turned to the lower uterine segment where a transverse hysterotomy was made with a scalpel and extended bilaterally bluntly. The infant was successfully delivered, and cord was clamped and cut and infant was handed over to awaiting neonatology team. Uterine massage was then administered and the placenta delivered intact with three-vessel cord. The uterus was cleared of clot and debris.  The hysterotomy was closed with 0 Vicryl in a running locked fashion, and an imbricating layer was also placed with a 0 Vicryl. Overall, excellent hemostasis was noted. The pelvis copiously irrigated and cleared of all clot and debris. Hemostasis was confirmed on all surfaces.  The peritoneum and the muscles were reapproximated using 0 vicryl interrupted stitches. The fascia was then closed using 0 Vicryl in a running fashion.  The skin was closed in a subcuticular fashion using 3.0 Vicryl. The patient tolerated the procedure well. Sponge, lap, instrument and needle counts were correct x 2. She was taken to the recovery room in stable condition.    CONSTANT,PEGGYMD  05/22/2015 3:06 AM

## 2015-05-22 NOTE — Anesthesia Postprocedure Evaluation (Signed)
  Anesthesia Post-op Note  Patient: Latoya Barr  Procedure(s) Performed: Procedure(s): CESAREAN SECTION (N/A)  Patient Location: Mother/Baby  Anesthesia Type:Epidural  Level of Consciousness: awake, alert  and oriented  Airway and Oxygen Therapy: Patient Spontanous Breathing  Post-op Pain: none  Post-op Assessment: Post-op Vital signs reviewed and Patient's Cardiovascular Status Stable LLE Motor Response: Purposeful movement LLE Sensation: Numbness RLE Motor Response: Purposeful movement RLE Sensation: Numbness L Sensory Level: T12-Inguinal (groin) region R Sensory Level: L1-Inguinal (groin) region  Post-op Vital Signs: Reviewed and stable  Last Vitals:  Filed Vitals:   05/22/15 0630  BP: 141/79  Pulse: 111  Temp: 37.3 C  Resp: 18    Complications: No apparent anesthesia complications

## 2015-05-22 NOTE — Transfer of Care (Signed)
Immediate Anesthesia Transfer of Care Note  Patient: Latoya Barr  Procedure(s) Performed: Procedure(s): CESAREAN SECTION (N/A)  Patient Location: PACU  Anesthesia Type:Epidural  Level of Consciousness: awake, alert  and oriented  Airway & Oxygen Therapy: Patient Spontanous Breathing  Post-op Assessment: Report given to RN and Post -op Vital signs reviewed and stable  Post vital signs: Reviewed and stable  Last Vitals:  Filed Vitals:   05/22/15 0131  BP: 157/89  Pulse: 99  Temp:   Resp:     Complications: No apparent anesthesia complications

## 2015-05-22 NOTE — Addendum Note (Signed)
Addendum  created 05/22/15 0800 by Janeece Agee, CRNA   Modules edited: Notes Section   Notes Section:  File: [id number]

## 2015-05-22 NOTE — Progress Notes (Signed)
RN consulted with CSW due to concerns about [name] being tired and overwhelmed.  Consult was placed in order for CSW to complete psychosocial assessment and provide psychosocial support.  Upon chart review, CSW noted that [name] received prenatal care in [place], but records have not yet been received.   Infant's UDS and MDS to be collected since prenatal care has not yet been confirmed.   CSW spoke with pediatrician and charge RN in CN.  Charge RN spoke to beside RN to initiate collection.   CSW to complete psychosocial assessment on 9/29 since [name] is tired and recovering from her C-section.  

## 2015-05-22 NOTE — Progress Notes (Signed)
Patient ID: Latoya Barr, female   DOB: 1991/01/13, 24 y.o.   MRN: [id number] Has been pushing for about an hour.   Had pushed before but then rested and labored down   Filed Vitals:   05/21/15 2301 05/21/15 2331 05/22/15 0001 05/22/15 0010  BP: 174/112 138/80 169/91 150/49  Pulse: 102 94 95 99  Temp: 99.6 F (37.6 C)     TempSrc: Oral     Resp: Height:      Weight:      SpO2:       FHR nonreactive UCs every 2 min  Dilation: 10 Dilation Complete Date: 05/21/15 Dilation Complete Time: 2030 Effacement (%): 100 Cervical Position: Anterior Station: 0 Presentation: Vertex Exam by:: Williams CNM   Pushes well intermittently .  Good pushing effort is not consistent Vertex feels like it is LOA, some molding, some caput  Plenty of room in pelvis, but maternal effort is not optimal  Encouraged in her pushing efforts. Continue to push

## 2015-05-23 ENCOUNTER — Encounter (HOSPITAL_COMMUNITY): Payer: Self-pay | Admitting: Obstetrics and Gynecology

## 2015-05-23 LAB — GLUCOSE, CAPILLARY
GLUCOSE-CAPILLARY: 111 mg/dL — AB (ref 65–99)
Glucose-Capillary: 85 mg/dL (ref 65–99)
Glucose-Capillary: 85 mg/dL (ref 65–99)

## 2015-05-23 NOTE — Clinical Social Work Maternal (Signed)
CLINICAL SOCIAL WORK MATERNAL/CHILD NOTE  Patient Details  Name: Latoya Barr MRN: [id number] Date of Birth: Oct 18, 1990  Date:  05/23/2015  Clinical Social Worker Initiating Note:  Loleta Books MSW, LCSW Date/ Time Initiated:  05/23/15/1015     Child's Name:  Latoya Barr    Legal Guardian:  [name]   Need for Interpreter:  None   Date of Referral:  05/22/15     Reason for Referral: Parental support  Referral Source:  [facility]   Address:  [address] [place], [place] [address]  Phone number:  [phone]   Household Members:  Significant Other   Natural Supports (not living in the home):  Friends   Professional Supports: None   Employment: Consulting civil engineer   Type of Work:   N/A  Education:  Holiday representative Resources:  [employer], Media planner   Other Resources:    None identified   Cultural/Religious Considerations Which May Impact Care:  None reported  Strengths:  Ability to meet basic needs , Home prepared for child    Risk Factors/Current Problems: 1) Increased risk for postpartum depression due to minimal support (is attending college out of home state) and [name] reporting feeling overwhelmed as she becomes a first time mother and finishes her final semester of college.   Cognitive State:  Able to Concentrate , Alert , Goal Oriented , Linear Thinking    Mood/Affect:  Overwhelmed , Interested    CSW Assessment:  CSW spoke to bedside RN on 9/28 who expressed concerns about [name]'s transition postpartum since she appeared tired, overwhelmed, and minimally supported.  [name] was alone in her room when CSW and MSW arrived.  [name] presented as easily engaged and receptive to the visit. She stated that she was tired and overwhelmed, but she presented in a pleasant mood and displayed a full range in affect.  When infant began to cry, she continued to remain calm and did not present as frustrated and overwhelmed. During the visit, the FOB arrived; however, he did  not engage.    [name] openly discussed her feelings of exhaustion and being overwhelmed secondary to long labor, C-section, and difficulties with infant sleeping at night. She continues to process her experiences in L&D, including how tired she was during the painful contractions. [name] stated that she had been asking for a C-section since she wanted labor to be "over".  She shared that she had not wanted a C-section, but is not upset about the procedure since it allowed the infant to be born.  [name] reported that since she has transitioned postpartum, she has had few opportunities to sleep since the infant continues to cry. She shared belief that the infant is not eating enough, and reported that the infant cries frequently. She shared that it is stressful for her since she sometimes can not identify the reason why the infant is crying.  [name] shared that she also has noted that the FOB is not as responsive to the infant's cries, which causes her to respond to the infant's needs each times.  [name] stated that she does not identify her relationship with the FOB as a problem or stress. She shared that they are engaged and live together.  [name] reported that he goes to work and he is involved, but she stated that she prefers that he is not going to be intensively "hands on" with the infant.  She shared belief that her relationship and their styles of interactions are ideal for her and him.  [name] continues to process her feelings related to giving birth without her mother being present. [name] stated that she attends [employer] and that her family lives in [place].  She shared that she was able to be on the phone with her mother during the process, but continues to process feelings of loss secondary to her absence.  Per [name], she has "a few" friends to provide her with support once she is discharged home, but stated that she is primarily alone.  [name] discussed how she is currently in her final semester of college, and she is on track to  graduate in December.  She stated that she is "overwhelmed" with becoming a mother and finishing college.  [name] reported supportive professors, and she stated that she is able to complete coursework online. She stated that the FOB works evenings, which also will allow him to be home during the day in order for her to complete course.  [name] presented with few strategies to assist her to self-regulate and cope with stress. She stated that she "prays", and has a strong faith.   CSW continued to explore and discuss with [name] strategies to incorporate into her daily routine in order to support her mental health postpartum.  [name] only identified desire to "sleep".    [name] denied history of mental health concerns, but reported that she has previously participated in therapy at the counseling center at school.  She shared that she initiated therapy since she was "overwhelmed" with school, and found it helpful to express her feelings to someone.  [name] acknowledged how this strategy may continue to be helpful for her as she transitions postpartum.  CSW reviewed [name]'s risk factors for developing PPD. [name] stated that her mother has already discussed the signs and symptoms with her. The [name] shared that she intends to be positive, optimistic, and "not place myself in a situation where I could feel depressed".  [name] agreed that she can return to the counseling center and talk to her OB if she ntoes onset of symptoms.  [name] denied additional questions, concerns, or needs at this time.  With guidance, [name] was able to acknowledge and identify that she is doing a "good job" with caring for the infant despite the infant constantly crying and sometimes having a difficult time identifying source of the cry.   CSW Plan/Description:   1)Patient/Family Education: Perinatal mood disorders  2)Information/Referral to [facility]: [name] to follow up at counseling center at [employer] to initiate therapy if she begins to feel need to talk to  someone about how she is feeling.  3)No Further Intervention Required/No Barriers to Discharge    Kelby Fam 05/23/2015, 11:11 AM

## 2015-05-23 NOTE — Progress Notes (Signed)
Post Op Day 1  Subjective:  Latoya Barr is a 24 y.o. G1P1001 [redacted]w[redacted]d s/p pLTCS for failure to descend.  No acute events overnight.  Pt denies problems with ambulating, voiding or po intake.  She denies nausea or vomiting.  Pain is moderately controlled.  She has had flatus. She has not had bowel movement.  Lochia Moderate.  Plan for birth control is patch.  Method of Feeding: Breast.  Objective: BP 122/65 mmHg  Pulse 86  Temp(Src) 97.8 F (36.6 C) (Oral)  Resp 20  Ht  (1.6 m)  Wt 217 lb (98.431 kg)  BMI 38.45 kg/m2  SpO2 100%  LMP 07/15/2014  Breastfeeding? Unknown  Physical Exam:  General: alert, cooperative and no distress Lochia:normal flow Chest: non-labored breathing Heart: regular rate Abdomen: soft, nontender, fundus firm at/below umbilicus DVT Evaluation: No evidence of DVT seen on physical exam. Extremities: no edema   Recent Labs  05/21/15 0307 05/22/15 1206  HGB 11.0* 10.3*  HCT 32.8* 31.5*    Assessment/Plan:  ASSESSMENT: Latoya Barr is a 24 y.o. G1P1001 [redacted]w[redacted]d POD#1 s/p pLTCS doing well.   Plan for discharge tomorrow   LOS: 2 days   Tarri Abernethy, MD 05/23/2015, 6:42 AM   CNM attestation Post Partum Day #1 I have seen and examined this patient and agree with above documentation in the resident's note.   Latoya Barr is a 24 y.o. G1P1001 s/p pLTCS.  Pt denies problems with ambulating, voiding or po intake. Pain is well controlled.  Plan for birth control is Lucienne Minks, but need to review other methods due it incompatibility w/ breastfeeding.  Method of Feeding: breast  PE:  BP 115/56 mmHg  Pulse 81  Temp(Src) 97.8 F (36.6 C) (Oral)  Resp 16  Ht  (1.6 m)  Wt 98.431 kg (217 lb)  BMI 38.45 kg/m2  SpO2 100%  LMP 07/15/2014  Breastfeeding? Unknown Fundus firm  Plan for discharge: 05/24/15  Latoya Barr, CNM 8:43 AM 05/23/2015
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
## 2015-05-24 NOTE — Progress Notes (Signed)
POSTPARTUM PROGRESS NOTE  Post Partum Day 2 Subjective:  Latoya Barr is a 24 y.o. G1P1001 [redacted]w[redacted]d s/p pltcs 2/2 failure to descend.  No acute events overnight.  Some pain at epidural site. Pt denies problems with ambulating, voiding or po intake.  She denies nausea or vomiting.  Pain is moderately controlled.  She has had flatus. She has not had bowel movement.  Lochia Small.   Objective: Blood pressure 146/75, pulse 77, temperature 98 F (36.7 C), temperature source Oral, resp. rate 17, height  (1.6 m), weight 217 lb (98.431 kg), last menstrual period 07/15/2014, SpO2 100 %, unknown if currently breastfeeding.  Physical Exam:  General: alert, cooperative and no distress Lochia:normal flow Chest: CTAB Heart: RRR no m/r/g Abdomen: +BS, soft, nontender, incision c/d/i Uterine Fundus: firm,  DVT Evaluation: No calf swelling or tenderness Extremities: trace edema   Recent Labs  05/22/15 1206  HGB 10.3*  HCT 31.5*    Assessment/Plan:  ASSESSMENT: Latoya Barr is a 24 y.o. G1P1001 [redacted]w[redacted]d s/p pltcs, healing well, wants to stay until tomorrow, adamant about this. Fasting glucose ppd 1 85, appropriate, not continuing a2gdm meds (glypizide).   Plan for discharge tomorrow   LOS: 3 days   Silvano Bilis 05/24/2015, 8:10 AM

## 2015-05-24 NOTE — Progress Notes (Signed)
Patient states did not have any pain from cut of did not know where toe was cut

## 2015-05-25 MED ORDER — IBUPROFEN 600 MG PO TABS: 600.0000 mg | ORAL_TABLET | Freq: Four times a day (QID) | ORAL | Status: AC | PRN

## 2015-05-25 MED ORDER — OXYCODONE-ACETAMINOPHEN 5-325 MG PO TABS: 1.0000 | ORAL_TABLET | ORAL | Status: AC | PRN

## 2015-05-25 NOTE — Discharge Instructions (Signed)

## 2015-05-25 NOTE — Lactation Note (Signed)
This note was copied from the chart of Latoya Barr. Lactation Consultation Note  Patient Name: Latoya Henli Hey BJYNW'G Date: 05/25/2015 Reason for consult: Follow-up assessment   With this mom of a term baby, now 36 hours old. Mom is mostly formula feeding, and doing some breastfeeding . The baby was beginning to cry, while sucking on a pacifier. Mom said the baby was eating every 2-3 hours. She was limiting the amount to 30 ml's. I told mom to increase her to 40 ml's plus now, and she should go longer in between feeding. I also explained to mom that if she wants to continue with breastfeeding, she will need to pump  to protect her milk supply - up to 8 times a day. Mom is active with [facility], and has a DEP at home. Mom knows to call lactation for questions/concerns.   Maternal Data    Feeding    LATCH Score/Interventions                      Lactation Tools Discussed/Used WIC Program: Yes   Consult Status Consult Status: Complete Follow-up type: Call as needed    Alfred Levins 05/25/2015, 11:18 AM

## 2015-05-25 NOTE — Discharge Summary (Signed)
OB Discharge Summary     Patient Name: Latoya Barr DOB: [birth year] MRN: [id number]  Date of admission: 05/21/2015 Delivering MD: Latoya Barr   Date of discharge: 05/25/2015  Admitting diagnosis: IUP@39 .1wks, Latent labor Intrauterine pregnancy: [redacted]w[redacted]d     Secondary diagnosis: Gestational Diabetes medication controlled (A2)     Discharge diagnosis: Term Pregnancy Delivered and GDM A2                                                                                                Post partum procedures:none  Augmentation: Pitocin  Complications: None  Hospital course:  Onset of Labor With Unplanned C/S  24 y.o. yo G1P1001 at [redacted]w[redacted]d was admitted in Latent Labor on 05/21/2015. She was receiving PNC in [place] but now lives in [place]. Patient had a labor course significant for becoming complete and pushing x 3hrs without descent of the vertex past 0 station. Membrane Rupture Time/Date: 5:10 PM ,05/21/2015   The patient went for cesarean section due to Arrest of Descent, and delivered a Viable infant,05/22/2015  Details of operation can be found in separate operative note. Patient had an uncomplicated postpartum course.  She is ambulating,tolerating a regular diet, passing flatus, and urinating well.  Patient is discharged home in stable condition No discharge date for patient encounter.Marland Kitchen   Physical exam  Filed Vitals:   05/24/15 1818 05/24/15 1905 05/24/15 2216 05/25/15 0609  BP: 164/84 146/70 114/65 140/73  Pulse: 71 77 69 81  Temp: 98.2 F (36.8 C) 98.2 F (36.8 C) 98.6 F (37 C) 98.3 F (36.8 C)  TempSrc:   Oral Oral  Resp: Height:      Weight:      SpO2:       General: alert and cooperative Lochia: appropriate Uterine Fundus: firm Incision: Dressing is clean, dry, and intact DVT Evaluation: No evidence of DVT seen on physical exam. Labs: Lab Results  Component Value Date   WBC 15.4* 05/22/2015   HGB 10.3* 05/22/2015   HCT 31.5* 05/22/2015   MCV 85.1 05/22/2015    PLT 262 05/22/2015   CMP Latest Ref Rng 04/26/2015  Glucose 65 - 99 mg/dL 409(W)  BUN 6 - 20 mg/dL 7  Creatinine 1.19 - 1.47 mg/dL 8.29  Sodium 562 - 130 mmol/L 136  Potassium 3.5 - 5.1 mmol/L 3.6  Chloride 101 - 111 mmol/L 99(L)  CO2 22 - 32 mmol/L 25  Calcium 8.9 - 10.3 mg/dL 9.0  Total Protein 6.5 - 8.1 g/dL 6.6  Total Bilirubin 0.3 - 1.2 mg/dL 0.4  Alkaline Phos 38 - 126 U/L 112  AST 15 - 41 U/L 31  ALT 14 - 54 U/L 25    Discharge instruction: per After Visit Summary and "Baby and Me Booklet".  Medications:    Medication List    STOP taking these medications        glyBURIDE 2.5 MG tablet  Commonly known as:  DIABETA      TAKE these medications        ibuprofen 600 MG tablet  Commonly known as:  ADVIL,MOTRIN  Take 1 tablet (600 mg total) by mouth every 6 (six) hours as needed for mild pain.     oxyCODONE-acetaminophen 5-325 MG tablet  Commonly known as:  PERCOCET/ROXICET  Take 1-2 tablets by mouth every 4 (four) hours as needed (for pain scale 4-7).     prenatal multivitamin Tabs tablet  Take 1 tablet by mouth daily at 12 noon.       Diet: routine diet  Activity: Advance as tolerated. Pelvic rest for 6 weeks.   Outpatient follow up:4 weeks  Postpartum contraception: Will review at [facility]; desires Ortho Evra but is still breastfeeding some  Newborn Data: Live born female  Birth Weight: 7 lb 14.1 oz (3575 g) APGAR: 8, 9  Baby Feeding: Bottle and Breast Disposition:home with mother  Needs 2hr glucola at [facility] visit.  05/25/2015 Latoya Barr, CNM  8:49 AM

## 2015-05-31 ENCOUNTER — Inpatient Hospital Stay (HOSPITAL_COMMUNITY)
Admission: AD | Admit: 2015-05-31 | Discharge: 2015-05-31 | Disposition: A | Discharge status: Home / Self Care | Payer: BLUE CROSS/BLUE SHIELD | Source: Ambulatory Visit | Attending: Family Medicine | Admitting: Family Medicine

## 2015-05-31 ENCOUNTER — Encounter (HOSPITAL_COMMUNITY): Payer: Self-pay | Admitting: *Deleted

## 2015-05-31 DIAGNOSIS — R112 Nausea with vomiting, unspecified: Secondary | ICD-10-CM | POA: Diagnosis not present

## 2015-05-31 DIAGNOSIS — O9089 Other complications of the puerperium, not elsewhere classified: Secondary | ICD-10-CM | POA: Diagnosis present

## 2015-05-31 LAB — URINALYSIS, ROUTINE W REFLEX MICROSCOPIC
BILIRUBIN URINE: NEGATIVE
Glucose, UA: NEGATIVE mg/dL
KETONES UR: 15 mg/dL — AB
NITRITE: NEGATIVE
PH: 6.5 (ref 5.0–8.0)
Protein, ur: 30 mg/dL — AB
SPECIFIC GRAVITY, URINE: 1.01 (ref 1.005–1.030)
UROBILINOGEN UA: 1 mg/dL (ref 0.0–1.0)

## 2015-05-31 LAB — URINE MICROSCOPIC-ADD ON

## 2015-05-31 MED ORDER — PROMETHAZINE HCL 25 MG/ML IJ SOLN
25.0000 mg | Freq: Once | INTRAMUSCULAR | Status: AC
  Administered 2015-05-31: 25 mg via INTRAMUSCULAR
  Filled 2015-05-31: qty 1

## 2015-05-31 MED ORDER — GI COCKTAIL ~~LOC~~: 30.0000 mL | Freq: Once | ORAL | Status: DC

## 2015-05-31 MED ORDER — ONDANSETRON 8 MG PO TBDP: 8.0000 mg | ORAL_TABLET | Freq: Three times a day (TID) | ORAL | Status: AC | PRN

## 2015-05-31 NOTE — MAU Note (Addendum)
PT SAYS SHE DEL ON 9-28   BY C/S   BY   DR Adrian Blackwater   ,  WENT  HOME 9-30.   SHE ATE WHILE HERE  SOME BUT NO APPETITE.    HAD BM YESTERDAY.  HAS BEEN EATING  YOGURT . TODAY  ATE  SOUP-  VOMITED  .  NO NAUSEA  NOW-  SAYS UPPER ABD  HURTS.    INCISION INTACT  WITH MESH DSG   WAS BREAST FEEDING -  BUT  NOW   BOTTLE FEEEDING

## 2015-05-31 NOTE — MAU Provider Note (Signed)
History     CSN: [id number]  Arrival date and time: 05/31/15 0221   First Provider Initiated Contact with Patient 05/31/15 0325      No chief complaint on file.  HPI Comments: Latoya Barr is a 24 y.o. G1P1001 who is s/p c-section on 05/22/15. She states that she has been fine at home, and has had several BM since being at home. However today she has been very nauseous and has vomited several times. She denies any fever, or sick contacts. Reviewed notes from when she was inpatient. Asked about social stressors at home, and if her boyfriend is there living with her. She reports that everything is fine at home, and that her boyfriend is living there. She does not have concerns at this time, and feels safe there.   Emesis  This is a new problem. The current episode started today. The problem occurs 2 to 4 times per day. The problem has been unchanged. The emesis has an appearance of stomach contents. There has been no fever. Associated symptoms include abdominal pain. Pertinent negatives include no diarrhea or fever. Risk factors: no sick contacts or risk factors.  She has tried nothing for the symptoms.   Past Medical History  Diagnosis Date  . Gestational diabetes     on medication    Past Surgical History  Procedure Laterality Date  . No past surgeries    . Cesarean section N/A 05/22/2015    Procedure: CESAREAN SECTION;  Surgeon: Catalina Antigua, MD;  Location: [facility];  Service: Obstetrics;  Laterality: N/A;    Family History  Problem Relation Age of Onset  . Diabetes Neg Hx   . Heart attack Neg Hx     Social History  Substance Use Topics  . Smoking status: Never Smoker   . Smokeless tobacco: None  . Alcohol Use: No    Allergies:  Allergies  Allergen Reactions  . Penicillins Hives    Blood pressure drops Has patient had a PCN reaction causing immediate rash, facial/tongue/throat swelling, SOB or lightheadedness with hypotension: No Has patient had a PCN reaction causing  severe rash involving mucus membranes or skin necrosis: Yes Has patient had a PCN reaction that required hospitalization Yes Has patient had a PCN reaction occurring within the last 10 years: No If all of the above answers are "NO", then may proceed with Cephalosporin use.   Marland Kitchen Amoxicillin Hives    Blood pressure drops  . Ampicillin Hives    Blood pressure drops    Prescriptions prior to admission  Medication Sig Dispense Refill Last Dose  . ibuprofen (ADVIL,MOTRIN) 600 MG tablet Take 1 tablet (600 mg total) by mouth every 6 (six) hours as needed for mild pain. 30 tablet 0 Past Week at Unknown time  . Prenatal Vit-Fe Fumarate-FA (PRENATAL MULTIVITAMIN) TABS tablet Take 1 tablet by mouth daily at 12 noon.   Past Week at Unknown time  . oxyCODONE-acetaminophen (PERCOCET/ROXICET) 5-325 MG tablet Take 1-2 tablets by mouth every 4 (four) hours as needed (for pain scale 4-7). 50 tablet 0 05/29/2015    Review of Systems  Constitutional: Negative for fever.  Gastrointestinal: Positive for nausea, vomiting and abdominal pain. Negative for diarrhea and constipation (last BM 05/29/16 ).  Genitourinary: Negative for dysuria, urgency and frequency.   Physical Exam   Blood pressure 141/82, pulse 66, temperature 99 F (37.2 C), temperature source Oral, resp. rate 20, last menstrual period 07/15/2014, unknown if currently breastfeeding.  Physical Exam  Nursing note and vitals  reviewed. Constitutional: She is oriented to person, place, and time. She appears well-developed and well-nourished. No distress.  Cardiovascular: Normal rate.   Respiratory: Effort normal.  GI: Soft. There is no tenderness. There is no rebound.  Honeycomb dressing removed. Incision C/D/I, well healed.   Neurological: She is alert and oriented to person, place, and time.  Skin: Skin is warm and dry.  Psychiatric: She has a normal mood and affect.    MAU Course  Procedures  MDM 25 phenergan given IM  0428: Patient  reports feeling better at this time. She has been tolerating PO  Assessment and Plan   1. Non-intractable vomiting with nausea, vomiting of unspecified type    DC home Comfort measures reviewed  c-section after care reviewed  RX: zofran  ODT PRN #20  Return to MAU as needed FU with OB as planned  Follow-up Information    Follow up with [facility].   Specialty:  Obstetrics and Gynecology   Why:  As scheduled   Contact information:   [address] [place] [address] [phone]        Tawnya Crook 05/31/2015, 3:26 AM

## 2015-05-31 NOTE — Discharge Instructions (Signed)

## 2015-05-31 NOTE — MAU Note (Signed)
SAYS FEELS  BETTER-  EATING  ICE CHIPS

## 2015-06-24 ENCOUNTER — Ambulatory Visit: Payer: BLUE CROSS/BLUE SHIELD | Admitting: Obstetrics & Gynecology

## 2016-03-15 ENCOUNTER — Emergency Department (HOSPITAL_COMMUNITY)
Admission: EM | Admit: 2016-03-15 | Discharge: 2016-03-15 | Disposition: A | Discharge status: Home / Self Care | Payer: BLUE CROSS/BLUE SHIELD | Attending: Emergency Medicine | Admitting: Emergency Medicine

## 2016-03-15 ENCOUNTER — Encounter (HOSPITAL_COMMUNITY): Payer: Self-pay

## 2016-03-15 DIAGNOSIS — R21 Rash and other nonspecific skin eruption: Secondary | ICD-10-CM | POA: Diagnosis present

## 2016-03-15 DIAGNOSIS — L259 Unspecified contact dermatitis, unspecified cause: Secondary | ICD-10-CM | POA: Diagnosis not present

## 2016-03-15 MED ORDER — TRIAMCINOLONE ACETONIDE 0.1 % EX CREA: 1.0000 "application " | TOPICAL_CREAM | Freq: Two times a day (BID) | CUTANEOUS | 0 refills | Status: AC

## 2016-03-15 NOTE — ED Provider Notes (Signed)
[facility]-EMERGENCY DEPT Provider Note   CSN: [id number] Arrival date & time: 03/15/16  1759  First Provider Contact: 7:05 PM    By signing my name below, I, [facility], attest that this documentation has been prepared under the direction and in the presence of Fayrene Helper, PA-C. Electronically Signed: Randell Patient, ED Scribe. 03/15/16. 6:56 PM.   History   Chief Complaint Chief Complaint  Patient presents with  . Rash    HPI Latoya Barr is a 25 y.o. female who presents to the Emergency Department complaining of constant, mildly painful and pruritic, gradually worsening rash to her left-sided neck onset 2 hours ago. Pt states that she changed soaps from [place] to [employer] 1 week ago, was wearing a velvet choker earlier today, and noticed a rash around her neck, worse on the left side. Denies similar symptoms in the past. Denies changes in soaps or detergents. Denies trouble swallowing, difficulty breathing, wheezing, lightheadedness, or dizziness.  The history is provided by the patient. No language interpreter was used.    Past Medical History:  Diagnosis Date  . Gestational diabetes    on medication    Patient Active Problem List   Diagnosis Date Noted  . Gestational diabetes mellitus, class A2 05/21/2015  . Gestational diabetes mellitus, antepartum 04/26/2015    Past Surgical History:  Procedure Laterality Date  . CESAREAN SECTION N/A 05/22/2015   Procedure: CESAREAN SECTION;  Surgeon: Catalina Antigua, MD;  Location: [facility];  Service: Obstetrics;  Laterality: N/A;  . NO PAST SURGERIES      OB History    Gravida Para Term Preterm AB Living   1 1 1     1    SAB TAB Ectopic Multiple Live Births         0         Home Medications    Prior to Admission medications   Medication Sig Start Date End Date Taking? Authorizing Provider  ibuprofen (ADVIL,MOTRIN) 600 MG tablet Take 1 tablet (600 mg total) by mouth every 6 (six) hours as needed for mild pain. 05/25/15    Arabella Merles, CNM  ondansetron (ZOFRAN ODT) 8 MG disintegrating tablet Take 1 tablet (8 mg total) by mouth every 8 (eight) hours as needed for nausea or vomiting. 05/31/15   Armando Reichert, CNM  oxyCODONE-acetaminophen (PERCOCET/ROXICET) 5-325 MG tablet Take 1-2 tablets by mouth every 4 (four) hours as needed (for pain scale 4-7). 05/25/15   Arabella Merles, CNM  Prenatal Vit-Fe Fumarate-FA (PRENATAL MULTIVITAMIN) TABS tablet Take 1 tablet by mouth daily at 12 noon.    Historical Provider, MD    Family History Family History  Problem Relation Age of Onset  . Diabetes Neg Hx   . Heart attack Neg Hx     Social History Social History  Substance Use Topics  . Smoking status: Never Smoker  . Smokeless tobacco: Never Used  . Alcohol use No     Allergies   Penicillins; Amoxicillin; and Ampicillin   Review of Systems Review of Systems  HENT: Negative for facial swelling and trouble swallowing.   Respiratory: Negative for shortness of breath and wheezing.   Skin: Positive for rash.     Physical Exam Updated Vital Signs BP 130/66 (BP Location: Right Arm)   Pulse 65   Temp 98.7 F (37.1 C) (Oral)   Resp 16   LMP 02/20/2016 (Exact Date)   SpO2 97%   Physical Exam  Constitutional: She is oriented to person, place, and  time. She appears well-developed and well-nourished. No distress.  HENT:  Head: Normocephalic and atraumatic.  No rash in the mouth.  Eyes: Conjunctivae are normal.  Neck: Normal range of motion.  Cardiovascular: Normal rate and regular rhythm.   Normal heart.  Pulmonary/Chest: Effort normal. No respiratory distress.  Lungs CTA bilaterally.  Musculoskeletal: Normal range of motion.  Neurological: She is alert and oriented to person, place, and time.  Skin: Skin is warm and dry.  Localized skin irritant skin changes noted about the neck most signifcant to the L side of neck.  No petechiae/pustular/vesicular lesion.    Psychiatric: She has a normal mood and  affect. Her behavior is normal.  Nursing note and vitals reviewed.    ED Treatments / Results  DIAGNOSTIC STUDIES: Oxygen Saturation is 97% on RA, normal by my interpretation.    COORDINATION OF CARE: 6:50 PM Discussed treatment plan with pt at bedside and pt agreed to plan.    Procedures Procedures   Medications Ordered in ED Medications - No data to display   Initial Impression / Assessment and Plan / ED Course  I have reviewed the triage vital signs and the nursing notes.  Pertinent labs & imaging results that were available during my care of the patient were reviewed by me and considered in my medical decision making (see chart for details).  Clinical Course    BP 130/66 (BP Location: Right Arm)   Pulse 65   Temp 98.7 F (37.1 C) (Oral)   Resp 16   LMP 02/20/2016 (Exact Date)   SpO2 97%    Final Clinical Impressions(s) / ED Diagnoses   Final diagnoses:  Contact dermatitis    New Prescriptions New Prescriptions   No medications on file   I personally performed the services described in this documentation, which was scribed in my presence. The recorded information has been reviewed and is accurate.   7:06 PM Skin changes to neck region consistent with contact dermatitis, likely from a new choker that she wore recently.  Recommend avoidance of irritant.  Kenalog cream prescribed.  Return precaution given.     Fayrene Helper, PA-C 03/15/16 1907    Fayrene Helper, PA-C 03/15/16 1910    Leta Baptist, MD 03/16/16 [phone]

## 2016-03-15 NOTE — ED Triage Notes (Signed)
Per Pt, Pt was coming home from [place] when she noticed a rash to the left side of the neck. Pain started about an hour ago. Upon assessment, pt has macular rash with dryness noted to the left side of the neck. Denies fever or any other symptoms.

## 2016-04-19 ENCOUNTER — Emergency Department (HOSPITAL_COMMUNITY)
Admission: EM | Admit: 2016-04-19 | Discharge: 2016-04-19 | Discharge status: Against Medical Advice | Payer: BLUE CROSS/BLUE SHIELD

## 2016-04-19 NOTE — ED Notes (Signed)
No answer when called 2 times for triage

## 2016-04-19 NOTE — ED Notes (Signed)
Patient called again for triage - no answer
